# Patient Record
Sex: Male | Born: 1965 | Race: Black or African American | Hispanic: No | Marital: Married | Smoking: Never smoker
Health system: Southern US, Community
[De-identification: ages and names within clinical notes are randomized; demographics above are authoritative.]

## PROBLEM LIST (undated history)

## (undated) DIAGNOSIS — M1990 Unspecified osteoarthritis, unspecified site: Secondary | ICD-10-CM

## (undated) DIAGNOSIS — Z8719 Personal history of other diseases of the digestive system: Secondary | ICD-10-CM

## (undated) DIAGNOSIS — K219 Gastro-esophageal reflux disease without esophagitis: Secondary | ICD-10-CM

## (undated) DIAGNOSIS — K5120 Ulcerative (chronic) proctitis without complications: Secondary | ICD-10-CM

## (undated) DIAGNOSIS — R7303 Prediabetes: Secondary | ICD-10-CM

---

## 1999-06-01 HISTORY — PX: SIGMOIDOSCOPY: SUR1295

## 2004-02-14 ENCOUNTER — Emergency Department: Payer: Self-pay | Admitting: Internal Medicine

## 2005-12-18 ENCOUNTER — Other Ambulatory Visit: Payer: Self-pay

## 2005-12-18 ENCOUNTER — Emergency Department: Payer: Self-pay | Admitting: Emergency Medicine

## 2005-12-24 ENCOUNTER — Emergency Department: Payer: Self-pay | Admitting: Emergency Medicine

## 2006-01-09 ENCOUNTER — Ambulatory Visit: Payer: Self-pay

## 2007-05-01 ENCOUNTER — Other Ambulatory Visit: Payer: Self-pay

## 2007-05-01 ENCOUNTER — Emergency Department: Payer: Self-pay | Admitting: Emergency Medicine

## 2007-08-21 ENCOUNTER — Emergency Department: Payer: Self-pay | Admitting: Emergency Medicine

## 2007-08-22 ENCOUNTER — Ambulatory Visit: Payer: Self-pay | Admitting: Gastroenterology

## 2007-10-24 ENCOUNTER — Ambulatory Visit: Payer: Self-pay | Admitting: Gastroenterology

## 2007-11-01 ENCOUNTER — Ambulatory Visit: Payer: Self-pay | Admitting: Family Medicine

## 2007-12-09 ENCOUNTER — Ambulatory Visit: Payer: Self-pay | Admitting: Gastroenterology

## 2007-12-09 HISTORY — PX: COLONOSCOPY: SHX174

## 2008-03-02 ENCOUNTER — Emergency Department: Payer: Self-pay | Admitting: Internal Medicine

## 2008-07-24 ENCOUNTER — Ambulatory Visit: Payer: Self-pay | Admitting: Family Medicine

## 2008-11-05 ENCOUNTER — Ambulatory Visit: Payer: Self-pay | Admitting: Gastroenterology

## 2008-11-21 ENCOUNTER — Emergency Department: Payer: Self-pay | Admitting: Internal Medicine

## 2011-01-25 ENCOUNTER — Emergency Department: Payer: Self-pay | Admitting: Emergency Medicine

## 2011-01-25 LAB — BASIC METABOLIC PANEL
Creatinine: 1.1 mg/dL (ref 0.60–1.30)
EGFR (African American): >60
EGFR (Non-African Amer.): >60
Glucose: 89 mg/dL (ref 65–99)
Sodium: 142 mmol/L (ref 136–145)

## 2011-01-25 LAB — CBC
HCT: 45.1 % (ref 40.0–52.0)
MCH: 31.1 pg (ref 26.0–34.0)
MCV: 93 fL (ref 80–100)
Platelet: 166 10*3/uL (ref 150–440)

## 2011-01-27 ENCOUNTER — Other Ambulatory Visit: Payer: Self-pay

## 2011-01-27 ENCOUNTER — Emergency Department (HOSPITAL_COMMUNITY)
Admission: EM | Admit: 2011-01-27 | Discharge: 2011-01-28 | Disposition: A | Discharge status: Home / Self Care | Payer: BC Managed Care – PPO | Attending: Emergency Medicine | Admitting: Emergency Medicine

## 2011-01-27 ENCOUNTER — Emergency Department (HOSPITAL_COMMUNITY): Payer: BC Managed Care – PPO

## 2011-01-27 ENCOUNTER — Encounter: Payer: Self-pay | Admitting: *Deleted

## 2011-01-27 DIAGNOSIS — K219 Gastro-esophageal reflux disease without esophagitis: Secondary | ICD-10-CM | POA: Insufficient documentation

## 2011-01-27 DIAGNOSIS — R0789 Other chest pain: Secondary | ICD-10-CM | POA: Insufficient documentation

## 2011-01-27 HISTORY — DX: Gastro-esophageal reflux disease without esophagitis: K21.9

## 2011-01-27 LAB — CBC
MCH: 30.7 pg (ref 26.0–34.0)
Platelets: 151 10*3/uL (ref 150–400)
RBC: 4.27 MIL/uL (ref 4.22–5.81)
WBC: 9.2 10*3/uL (ref 4.0–10.5)

## 2011-01-27 LAB — BASIC METABOLIC PANEL
GFR calc non Af Amer: 85 mL/min — ABNORMAL LOW (ref >90)
Glucose, Bld: 106 mg/dL — ABNORMAL HIGH (ref 70–99)
Potassium: 4.2 mEq/L (ref 3.5–5.1)
Sodium: 137 mEq/L (ref 135–145)

## 2011-01-27 LAB — POCT I-STAT TROPONIN I: Troponin i, poc: 0.01 ng/mL (ref 0.00–0.08)

## 2011-01-27 LAB — DIFFERENTIAL
Basophils Absolute: 0 10*3/uL (ref 0.0–0.1)
Eosinophils Absolute: 0.2 10*3/uL (ref 0.0–0.7)
Lymphocytes Relative: 21 % (ref 12–46)
Lymphs Abs: 1.9 10*3/uL (ref 0.7–4.0)
Neutrophils Relative %: 71 % (ref 43–77)

## 2011-01-27 MED ORDER — MORPHINE SULFATE 4 MG/ML IJ SOLN
4.0000 mg | Freq: Once | INTRAMUSCULAR | Status: AC
  Administered 2011-01-27: 4 mg via INTRAVENOUS
  Filled 2011-01-27: qty 1

## 2011-01-27 MED ORDER — ONDANSETRON HCL 4 MG/2ML IJ SOLN
4.0000 mg | Freq: Once | INTRAMUSCULAR | Status: AC
  Administered 2011-01-27: 4 mg via INTRAVENOUS
  Filled 2011-01-27: qty 2

## 2011-01-27 MED ORDER — KETOROLAC TROMETHAMINE 30 MG/ML IJ SOLN
30.0000 mg | Freq: Once | INTRAMUSCULAR | Status: AC
  Administered 2011-01-28: 30 mg via INTRAVENOUS
  Filled 2011-01-27: qty 1

## 2011-01-27 NOTE — ED Notes (Signed)
Patient transported to X-ray 

## 2011-01-27 NOTE — ED Notes (Addendum)
C/o CP, denies any and or all other sx, onset Wednesday, seen by PCP on Wednesday, referred to card MD in [place], has not seen card MD yet, pain worsening since Wednesday, no aggravating or aleviating sx, NSR on monitor, given ASA 324 & ntg 2 sl PTA with some relief, rates pain on arrival at 4-5/10, "this feels different from GERD". Takes omeprazole & imdur.

## 2011-01-27 NOTE — ED Provider Notes (Signed)
History     CSN: [id number]  Arrival date & time 01/27/11  2033   First MD Initiated Contact with Patient 01/27/11 2038      Chief Complaint  Patient presents with  . Chest Pain    (Consider location/radiation/quality/duration/timing/severity/associated sxs/prior treatment) Patient is a 46 y.o. male presenting with chest pain. The history is provided by the patient.  Chest Pain The chest pain began 2 days ago. Chest pain occurs intermittently. The chest pain is worsening. Associated with: nothing. At its most intense, the pain is at 7/10. The pain is currently at 4/10. The quality of the pain is described as aching and sharp. The pain does not radiate. Exacerbated by: nothing. Pertinent negatives for primary symptoms include no fever, no fatigue, no shortness of breath, no cough and no palpitations.  Pertinent negatives for associated symptoms include no diaphoresis. He tried nitroglycerin and aspirin for the symptoms.   Pt states he started having left sided chest pains 3 days ago. States feels like a "toothache." Was seen in the hospital at [facility] 2 days ago, and also by a cardiologist at the same time. Had labs and and ECHO done. States everything was good. He was sent home and he has a fu appointment on Monday for a stress test. Pt states he called EMS because pain is increasing in severety. States comes independent of activity, not reproducible, lasts few seconds to minutes at a time, do not radiate. He was given SL nitro which he says he took and it is not helping. Denies n/v, dizziness, SOB, diaphoresis.   Past Medical History  Diagnosis Date  . GERD (gastroesophageal reflux disease)     History reviewed. No pertinent past surgical history.  Family History  Problem Relation Age of Onset  . Heart failure Father   . Stroke Other     History  Substance Use Topics  . Smoking status: Never Smoker   . Smokeless tobacco: Not on file  . Alcohol Use: No      Review of  Systems  Constitutional: Negative for fever, chills, diaphoresis and fatigue.  HENT: Negative.   Respiratory: Negative for cough and shortness of breath.   Cardiovascular: Positive for chest pain. Negative for palpitations and leg swelling.  Gastrointestinal: Negative.   Genitourinary: Negative.   Musculoskeletal: Negative.   Skin: Negative.   Neurological: Negative.   Psychiatric/Behavioral: Negative.     Allergies  Review of patient's allergies indicates no known allergies.  Home Medications  No current outpatient prescriptions on file.  BP 142/69  Pulse 64  Temp 98.7 F (37.1 C)  Resp 13  SpO2 100%  Physical Exam  Nursing note and vitals reviewed. Constitutional: He is oriented to person, place, and time. He appears well-developed and well-nourished.  HENT:  Head: Normocephalic.  Eyes: Conjunctivae are normal.  Neck: Neck supple.  Cardiovascular: Normal rate, regular rhythm and normal heart sounds.   Pulmonary/Chest: Effort normal and breath sounds normal. No respiratory distress. He has no wheezes. He has no rales. He exhibits no tenderness.  Abdominal: Soft. Bowel sounds are normal. He exhibits no distension. There is no tenderness.  Musculoskeletal: He exhibits no edema.  Neurological: He is alert and oriented to person, place, and time.  Skin: Skin is warm and dry.  Psychiatric: He has a normal mood and affect.    ED Course  Procedures (including critical care time)  9:04 PM Pt with atypical CP. No medical problems. Has fam hx of cardiac problems in his father  before age 51, no other risk factors. ECG normal. Pt received 325asa and 2 SL nitros prior to arrival. Will check labs and CXR. Pt states he has not had CXR yet. Will monitor.   Date: 01/27/2011  Rate: 60  Rhythm: normal sinus rhythm  QRS Axis: normal  Intervals: normal  ST/T Wave abnormalities: normal  Conduction Disutrbances:none  Narrative Interpretation:   Old EKG Reviewed: No significant  changes noted  Results for orders placed during the hospital encounter of 01/27/11  CBC      Component Value Range   WBC 9.2  4.0 - 10.5 (K/uL)   RBC 4.27  4.22 - 5.81 (MIL/uL)   Hemoglobin 13.1  13.0 - 17.0 (g/dL)   HCT 40.9 (*) 81.1 - 52.0 (%)   MCV 87.8  78.0 - 100.0 (fL)   MCH 30.7  26.0 - 34.0 (pg)   MCHC 34.9  30.0 - 36.0 (g/dL)   RDW 91.4  78.2 - 95.6 (%)   Platelets 151  150 - 400 (K/uL)  DIFFERENTIAL      Component Value Range   Neutrophils Relative 71  43 - 77 (%)   Neutro Abs 6.6  1.7 - 7.7 (K/uL)   Lymphocytes Relative 21  12 - 46 (%)   Lymphs Abs 1.9  0.7 - 4.0 (K/uL)   Monocytes Relative 6  3 - 12 (%)   Monocytes Absolute 0.6  0.1 - 1.0 (K/uL)   Eosinophils Relative 2  0 - 5 (%)   Eosinophils Absolute 0.2  0.0 - 0.7 (K/uL)   Basophils Relative 0  0 - 1 (%)   Basophils Absolute 0.0  0.0 - 0.1 (K/uL)  BASIC METABOLIC PANEL      Component Value Range   Sodium 137  135 - 145 (mEq/L)   Potassium 4.2  3.5 - 5.1 (mEq/L)   Chloride 101  96 - 112 (mEq/L)   CO2 29  19 - 32 (mEq/L)   Glucose, Bld 106 (*) 70 - 99 (mg/dL)   BUN 13  6 - 23 (mg/dL)   Creatinine, Ser 2.13  0.50 - 1.35 (mg/dL)   Calcium 9.1  8.4 - 08.6 (mg/dL)   GFR calc non Af Amer 85 (*) >90 (mL/min)   GFR calc Af Amer >90  >90 (mL/min)  POCT I-STAT TROPONIN I      Component Value Range   Troponin i, poc 0.01  0.00 - 0.08 (ng/mL)   Comment 3           TROPONIN I      Component Value Range   Troponin I <0.30  <0.30 (ng/mL)   Dg Chest 2 View  01/27/2011  *RADIOLOGY REPORT*  Clinical Data: 46 year old male with chest pain.  CHEST - 2 VIEW  Comparison: None  Findings: The cardiomediastinal silhouette is unremarkable. Mild peribronchial thickening is noted. There is no evidence of focal airspace disease, pulmonary edema, pulmonary nodule/mass, pleural effusion, or pneumothorax. No acute bony abnormalities are identified.  IMPRESSION: No evidence of acute cardiopulmonary disease.  Mild bronchitic changes.   Original Report Authenticated By: Rosendo Gros, M.D.     No significant findings on labs or CXR. Two sets of enzymes negative. Doubt acs. CP comes and goes, not pleuritic, pt denies SOB, VS all within normal. Perc negative. Doubt pe. Will d/c home. Pt has an appointment in two days for stress test. PT agrees with the plan.    MDM          Myriam Jacobson  [place], [place] 01/28/11 [phone]

## 2011-01-27 NOTE — ED Notes (Signed)
Here from house, wife to be following, no PCP, was seen Wednesday at [facility] ED, EDPA has been in to room, orders received and initiated.

## 2011-01-27 NOTE — ED Provider Notes (Signed)
BP 112/60  Pulse 62  Temp 98.7 F (37.1 C)  Resp 14  SpO2 99% Pt stable at this time Seen with PA EKG reviewed He reports CP without any other symptoms Stress test is scheduled next week He is well appearing, stable for d/c after second troponin   Joya Gaskins, MD 01/27/11 2328

## 2011-01-28 LAB — TROPONIN I: Troponin I: <0.3 ng/mL (ref <0.30)

## 2011-01-28 MED ORDER — HYDROCODONE-ACETAMINOPHEN 5-500 MG PO TABS: 1.0000 | ORAL_TABLET | Freq: Four times a day (QID) | ORAL | Status: AC | PRN

## 2011-01-28 NOTE — ED Notes (Signed)
Patient is AOx4 and pain free at discharge.  His daughter is here to give him a ride home.

## 2011-01-29 NOTE — ED Provider Notes (Signed)
Medical screening examination/treatment/procedure(s) were conducted as a shared visit with non-physician practitioner(s) and myself.  I personally evaluated the patient during the encounter   Alaija Ruble W Kelijah Towry, MD 01/29/11 0842 

## 2011-01-30 ENCOUNTER — Ambulatory Visit: Payer: Self-pay | Admitting: Internal Medicine

## 2012-06-24 ENCOUNTER — Ambulatory Visit: Payer: Self-pay | Admitting: Gastroenterology

## 2012-06-24 HISTORY — PX: COLONOSCOPY: SHX174

## 2013-07-18 ENCOUNTER — Ambulatory Visit: Payer: Self-pay | Admitting: Gastroenterology

## 2013-08-29 ENCOUNTER — Ambulatory Visit: Payer: Self-pay | Admitting: Family Medicine

## 2014-01-08 ENCOUNTER — Emergency Department: Payer: Self-pay | Admitting: Emergency Medicine

## 2014-01-08 LAB — BASIC METABOLIC PANEL
Anion Gap: 8 (ref 7–16)
BUN: 11 mg/dL (ref 7–18)
CALCIUM: 8.2 mg/dL — AB (ref 8.5–10.1)
CO2: 28 mmol/L (ref 21–32)
CREATININE: 1.11 mg/dL (ref 0.60–1.30)
Chloride: 103 mmol/L (ref 98–107)
EGFR (African American): >60
GLUCOSE: 99 mg/dL (ref 65–99)
OSMOLALITY: 277 (ref 275–301)
Potassium: 3.8 mmol/L (ref 3.5–5.1)
SODIUM: 139 mmol/L (ref 136–145)

## 2014-01-08 LAB — CBC
HCT: 42.9 % (ref 40.0–52.0)
HGB: 14.2 g/dL (ref 13.0–18.0)
MCH: 30.9 pg (ref 26.0–34.0)
MCHC: 33 g/dL (ref 32.0–36.0)
MCV: 93 fL (ref 80–100)
PLATELETS: 161 10*3/uL (ref 150–440)
RBC: 4.59 10*6/uL (ref 4.40–5.90)
RDW: 12.7 % (ref 11.5–14.5)
WBC: 7.4 10*3/uL (ref 3.8–10.6)

## 2014-01-08 LAB — TROPONIN I: Troponin-I: <0.02 ng/mL

## 2014-01-09 LAB — D-DIMER(ARMC): D-DIMER: 235 ng/mL

## 2014-01-19 ENCOUNTER — Emergency Department: Payer: Self-pay | Admitting: Emergency Medicine

## 2014-01-19 LAB — URINALYSIS, COMPLETE
BACTERIA: NONE SEEN
BILIRUBIN, UR: NEGATIVE
BLOOD: NEGATIVE
Glucose,UR: NEGATIVE mg/dL (ref 0–75)
Ketone: NEGATIVE
Leukocyte Esterase: NEGATIVE
NITRITE: NEGATIVE
PH: 8 (ref 4.5–8.0)
PROTEIN: NEGATIVE
RBC,UR: 9 /HPF (ref 0–5)
SQUAMOUS EPITHELIAL: NONE SEEN
Specific Gravity: 1.02 (ref 1.003–1.030)

## 2014-01-19 LAB — CBC WITH DIFFERENTIAL/PLATELET
BASOS PCT: 0.6 %
Basophil #: 0 10*3/uL (ref 0.0–0.1)
EOS ABS: 0.1 10*3/uL (ref 0.0–0.7)
EOS PCT: 1.3 %
HCT: 44.8 % (ref 40.0–52.0)
HGB: 14.8 g/dL (ref 13.0–18.0)
LYMPHS ABS: 1.4 10*3/uL (ref 1.0–3.6)
Lymphocyte %: 18.3 %
MCH: 30.6 pg (ref 26.0–34.0)
MCHC: 33 g/dL (ref 32.0–36.0)
MCV: 93 fL (ref 80–100)
Monocyte #: 0.6 x10 3/mm (ref 0.2–1.0)
Monocyte %: 7.6 %
NEUTROS ABS: 5.4 10*3/uL (ref 1.4–6.5)
Neutrophil %: 72.2 %
Platelet: 173 10*3/uL (ref 150–440)
RBC: 4.83 10*6/uL (ref 4.40–5.90)
RDW: 12.9 % (ref 11.5–14.5)
WBC: 7.4 10*3/uL (ref 3.8–10.6)

## 2014-01-19 LAB — COMPREHENSIVE METABOLIC PANEL
ALBUMIN: 4 g/dL (ref 3.4–5.0)
ALK PHOS: 73 U/L
ALT: 41 U/L
AST: 21 U/L (ref 15–37)
Anion Gap: 8 (ref 7–16)
BILIRUBIN TOTAL: 0.8 mg/dL (ref 0.2–1.0)
BUN: 13 mg/dL (ref 7–18)
CO2: 30 mmol/L (ref 21–32)
Calcium, Total: 8.6 mg/dL (ref 8.5–10.1)
Chloride: 102 mmol/L (ref 98–107)
Creatinine: 1.19 mg/dL (ref 0.60–1.30)
Glucose: 98 mg/dL (ref 65–99)
Osmolality: 279 (ref 275–301)
Potassium: 4.1 mmol/L (ref 3.5–5.1)
Sodium: 140 mmol/L (ref 136–145)
TOTAL PROTEIN: 7.9 g/dL (ref 6.4–8.2)

## 2014-01-19 LAB — TROPONIN I

## 2014-01-19 LAB — LIPASE, BLOOD: LIPASE: 73 U/L (ref 73–393)

## 2014-02-06 ENCOUNTER — Ambulatory Visit: Payer: Self-pay | Admitting: Gastroenterology

## 2014-02-06 HISTORY — PX: ESOPHAGOGASTRODUODENOSCOPY: SHX1529

## 2014-05-18 LAB — SURGICAL PATHOLOGY

## 2014-06-12 ENCOUNTER — Encounter: Payer: Self-pay | Admitting: Emergency Medicine

## 2014-06-12 ENCOUNTER — Ambulatory Visit
Admission: EM | Admit: 2014-06-12 | Discharge: 2014-06-12 | Disposition: A | Discharge status: Home / Self Care | Payer: Federal, State, Local not specified - PPO | Attending: Family Medicine | Admitting: Family Medicine

## 2014-06-12 DIAGNOSIS — N451 Epididymitis: Secondary | ICD-10-CM

## 2014-06-12 MED ORDER — SILDENAFIL CITRATE 50 MG PO TABS: 50.0000 mg | ORAL_TABLET | Freq: Every day | ORAL | Status: DC | PRN

## 2014-06-12 MED ORDER — DOXYCYCLINE HYCLATE 100 MG PO TABS: 100.0000 mg | ORAL_TABLET | Freq: Two times a day (BID) | ORAL | Status: DC

## 2014-06-12 NOTE — ED Provider Notes (Signed)
CSN: [id number]     Arrival date & time 06/12/14  1027 History   First MD Initiated Contact with Patient 06/12/14 1105     Chief Complaint  Patient presents with  . Groin Pain    left sided  . Testicle Pain    left    (Consider location/radiation/quality/duration/timing/severity/associated sxs/prior Treatment) HPI Comments: Patient presents with a 3 weeks h/o left scrotal pain, which is sharp, short lasting and intermittent. Denies any discharge, injury/trauma, redness, swelling, fevers, chills, weight loss, penile discharge, skin lesions. Also complains of erectile problems for months.   The history is provided by the patient.    Past Medical History  Diagnosis Date  . GERD (gastroesophageal reflux disease)    History reviewed. No pertinent past surgical history. Family History  Problem Relation Age of Onset  . Heart failure Father   . Stroke Other    History  Substance Use Topics  . Smoking status: Never Smoker   . Smokeless tobacco: Never Used  . Alcohol Use: No    Review of Systems  Allergies  Review of patient's allergies indicates no known allergies.  Home Medications   Prior to Admission medications   Medication Sig Start Date End Date Taking? Authorizing Provider  doxycycline (VIBRA-TABS) 100 MG tablet Take 1 tablet (100 mg total) by mouth 2 (two) times daily. 06/12/14   Payton Mccallumrlando Narely Nobles, MD  isosorbide mononitrate (IMDUR) 30 MG 24 hr tablet Take 30 mg by mouth daily.      Historical Provider, MD  omeprazole (PRILOSEC) 20 MG capsule Take 20 mg by mouth daily.      Historical Provider, MD  sildenafil (VIAGRA) 50 MG tablet Take 1 tablet (50 mg total) by mouth daily as needed for erectile dysfunction. 06/12/14   Payton Mccallumrlando Mercer Peifer, MD   BP 156/84 mmHg  Pulse 70  Temp(Src) 97.4 F (36.3 C) (Tympanic)  Resp 16  Ht 5\' 11"  (1.803 m)  Wt 215 lb (97.523 kg)  BMI 30.00 kg/m2  SpO2 99% Physical Exam  Constitutional: He appears well-developed. No distress.  Genitourinary:  Penis normal. Cremasteric reflex is present. Right testis shows no mass, no swelling and no tenderness. Left testis shows tenderness (mild at epididymis). Left testis shows no mass and no swelling.  Mild left epididymal tenderness to palpation  Skin: He is not diaphoretic.  Nursing note and vitals reviewed.   ED Course  Procedures (including critical care time) Labs Review Labs Reviewed - No data to display  Imaging Review No results found.   MDM   1. Epididymitis    Discharge Medication List as of 06/12/2014 11:21 AM    START taking these medications   Details  doxycycline (VIBRA-TABS) 100 MG tablet Take 1 tablet (100 mg total) by mouth 2 (two) times daily., Starting 06/12/2014, Until Discontinued, Normal    sildenafil (VIAGRA) 50 MG tablet Take 1 tablet (50 mg total) by mouth daily as needed for erectile dysfunction., Starting 06/12/2014, Until Discontinued, Normal      Plan: 1.Diagnosis reviewed with patient 2. rx as per orders; risks, benefits, potential side effects reviewed with patient 3. Recommend establish care with a PCP  4. F/u prn if symptoms worsen or don't improve    Payton Mccallumrlando Angeliyah Kirkey, MD 06/12/14 1407

## 2014-06-12 NOTE — Discharge Instructions (Signed)
Epididymitis °Epididymitis is a swelling (inflammation) of the epididymis. The epididymis is a cord-like structure along the back part of the testicle. Epididymitis is usually, but not always, caused by infection. This is usually a sudden problem beginning with chills, fever and pain behind the scrotum and in the testicle. There may be swelling and redness of the testicle. °DIAGNOSIS  °Physical examination will reveal a tender, swollen epididymis. Sometimes, cultures are obtained from the urine or from prostate secretions to help find out if there is an infection or if the cause is a different problem. Sometimes, blood work is performed to see if your white blood cell count is elevated and if a germ (bacterial) or viral infection is present. Using this knowledge, an appropriate medicine which kills germs (antibiotic) can be chosen by your caregiver. A viral infection causing epididymitis will most often go away (resolve) without treatment. °HOME CARE INSTRUCTIONS  °· Hot sitz baths for 20 minutes, 4 times per day, may help relieve pain. °· Only take over-the-counter or prescription medicines for pain, discomfort or fever as directed by your caregiver. °· Take all medicines, including antibiotics, as directed. Take the antibiotics for the full prescribed length of time even if you are feeling better. °· It is very important to keep all follow-up appointments. °SEEK IMMEDIATE MEDICAL CARE IF:  °· You have a fever. °· You have pain not relieved with medicines. °· You have any worsening of your problems. °· Your pain seems to come and go. °· You develop pain, redness, and swelling in the scrotum and surrounding areas. °MAKE SURE YOU:  °· Understand these instructions. °· Will watch your condition. °· Will get help right away if you are not doing well or get worse. °Document Released: 01/07/2000 Document Revised: 04/03/2011 Document Reviewed: 11/26/2008 °Exit[employer]® Patient Information ©2015 [employer]. This information  is not intended to replace advice given to you by your health care provider. Make sure you discuss any questions you have with your health care provider. ° °

## 2014-06-12 NOTE — ED Notes (Signed)
Patient c/o left groin pain and pain in his left testicle for the past 3 weeks.  Patient denies fevers. Patient denies pain when urinating.

## 2016-07-05 ENCOUNTER — Other Ambulatory Visit: Payer: Self-pay | Admitting: Family Medicine

## 2016-07-05 DIAGNOSIS — M7521 Bicipital tendinitis, right shoulder: Secondary | ICD-10-CM

## 2016-07-05 DIAGNOSIS — M7581 Other shoulder lesions, right shoulder: Secondary | ICD-10-CM

## 2016-07-12 ENCOUNTER — Ambulatory Visit
Admission: RE | Admit: 2016-07-12 | Discharge: 2016-07-12 | Disposition: A | Discharge status: Home / Self Care | Payer: BLUE CROSS/BLUE SHIELD | Source: Ambulatory Visit | Attending: Family Medicine | Admitting: Family Medicine

## 2016-07-12 DIAGNOSIS — M7521 Bicipital tendinitis, right shoulder: Secondary | ICD-10-CM | POA: Insufficient documentation

## 2016-07-12 DIAGNOSIS — M7581 Other shoulder lesions, right shoulder: Secondary | ICD-10-CM | POA: Diagnosis present

## 2016-08-23 ENCOUNTER — Encounter
Admission: RE | Disposition: A | Discharge status: Home / Self Care | Payer: Self-pay | Source: Ambulatory Visit | Attending: Surgery

## 2016-08-23 ENCOUNTER — Ambulatory Visit
Admission: RE | Admit: 2016-08-23 | Discharge: 2016-08-23 | Disposition: A | Discharge status: Home / Self Care | Payer: BLUE CROSS/BLUE SHIELD | Source: Ambulatory Visit | Attending: Surgery | Admitting: Surgery

## 2016-08-23 ENCOUNTER — Ambulatory Visit: Payer: BLUE CROSS/BLUE SHIELD | Admitting: Anesthesiology

## 2016-08-23 DIAGNOSIS — Z79899 Other long term (current) drug therapy: Secondary | ICD-10-CM | POA: Diagnosis not present

## 2016-08-23 DIAGNOSIS — K219 Gastro-esophageal reflux disease without esophagitis: Secondary | ICD-10-CM | POA: Diagnosis not present

## 2016-08-23 DIAGNOSIS — M25811 Other specified joint disorders, right shoulder: Secondary | ICD-10-CM | POA: Insufficient documentation

## 2016-08-23 DIAGNOSIS — M75101 Unspecified rotator cuff tear or rupture of right shoulder, not specified as traumatic: Secondary | ICD-10-CM | POA: Insufficient documentation

## 2016-08-23 HISTORY — PX: SHOULDER ARTHROSCOPY WITH ROTATOR CUFF REPAIR: SHX5685

## 2016-08-23 HISTORY — DX: Unspecified osteoarthritis, unspecified site: M19.90

## 2016-08-23 HISTORY — PX: SHOULDER OPEN ROTATOR CUFF REPAIR: SHX2407

## 2016-08-23 SURGERY — ARTHROSCOPY, SHOULDER, WITH ROTATOR CUFF REPAIR
Anesthesia: Regional | Laterality: Right | Wound class: Clean

## 2016-08-23 MED ORDER — LACTATED RINGERS IV SOLN
INTRAVENOUS | Status: DC
  Administered 2016-08-23 (×2): via INTRAVENOUS

## 2016-08-23 MED ORDER — LIDOCAINE HCL (CARDIAC) 20 MG/ML IV SOLN
INTRAVENOUS | Status: DC | PRN
  Administered 2016-08-23: 40 mg via INTRATRACHEAL

## 2016-08-23 MED ORDER — FENTANYL CITRATE (PF) 100 MCG/2ML IJ SOLN
INTRAMUSCULAR | Status: DC | PRN
  Administered 2016-08-23: 50 ug via INTRAVENOUS

## 2016-08-23 MED ORDER — OXYCODONE HCL 5 MG/5ML PO SOLN: 5.0000 mg | Freq: Once | ORAL | Status: DC | PRN

## 2016-08-23 MED ORDER — PROPOFOL 10 MG/ML IV BOLUS
INTRAVENOUS | Status: DC | PRN
  Administered 2016-08-23: 100 mg via INTRAVENOUS

## 2016-08-23 MED ORDER — DEXAMETHASONE SODIUM PHOSPHATE 4 MG/ML IJ SOLN
INTRAMUSCULAR | Status: DC | PRN
  Administered 2016-08-23: 4 mg via INTRAVENOUS

## 2016-08-23 MED ORDER — GLYCOPYRROLATE 0.2 MG/ML IJ SOLN
INTRAMUSCULAR | Status: DC | PRN
  Administered 2016-08-23: 0.1 mg via INTRAVENOUS

## 2016-08-23 MED ORDER — OXYCODONE HCL 5 MG PO TABS: 5.0000 mg | ORAL_TABLET | Freq: Once | ORAL | Status: DC | PRN

## 2016-08-23 MED ORDER — LACTATED RINGERS IV SOLN: INTRAVENOUS | Status: DC

## 2016-08-23 MED ORDER — ACETAMINOPHEN 10 MG/ML IV SOLN: 1000.0000 mg | Freq: Once | INTRAVENOUS | Status: DC | PRN

## 2016-08-23 MED ORDER — MIDAZOLAM HCL 5 MG/5ML IJ SOLN
INTRAMUSCULAR | Status: DC | PRN
  Administered 2016-08-23 (×2): 1 mg via INTRAVENOUS

## 2016-08-23 MED ORDER — ROPIVACAINE HCL 5 MG/ML IJ SOLN
INTRAMUSCULAR | Status: DC | PRN
  Administered 2016-08-23: 40 mL

## 2016-08-23 MED ORDER — OXYCODONE HCL 5 MG PO TABS: 5.0000 mg | ORAL_TABLET | ORAL | 0 refills | Status: DC | PRN

## 2016-08-23 MED ORDER — ONDANSETRON HCL 4 MG/2ML IJ SOLN: 4.0000 mg | Freq: Once | INTRAMUSCULAR | Status: DC | PRN

## 2016-08-23 MED ORDER — LIDOCAINE-EPINEPHRINE 1 %-1:100000 IJ SOLN
INTRAMUSCULAR | Status: DC | PRN
  Administered 2016-08-23: 20 mL

## 2016-08-23 MED ORDER — FENTANYL CITRATE (PF) 100 MCG/2ML IJ SOLN: 25.0000 ug | INTRAMUSCULAR | Status: DC | PRN

## 2016-08-23 MED ORDER — CEFAZOLIN SODIUM-DEXTROSE 2-4 GM/100ML-% IV SOLN: 2.0000 g | Freq: Once | INTRAVENOUS | Status: DC

## 2016-08-23 SURGICAL SUPPLY — 39 items
ANCHOR JUGGERKNOT WTAP NDL 2.9 (Anchor) ×4 IMPLANT
ANCHOR SUT QUATTRO KNTLS 4.5 (Anchor) ×4 IMPLANT
BIT DRILL JUGRKNT W/NDL BIT2.9 (DRILL) ×2 IMPLANT
BLADE FULL RADIUS 3.5 (BLADE) ×2 IMPLANT
BUR ACROMIONIZER 4.0 (BURR) IMPLANT
CANNULA SHAVER 8MMX76MM (CANNULA) ×2 IMPLANT
CHLORAPREP W/TINT 26ML (MISCELLANEOUS) ×4 IMPLANT
COVER LIGHT HANDLE UNIVERSAL (MISCELLANEOUS) ×4 IMPLANT
COVER MAYO STAND STRL (DRAPES) ×2 IMPLANT
DRAPE IMP U-DRAPE 54X76 (DRAPES) ×4 IMPLANT
DRILL JUGGERKNOT W/NDL BIT 2.9 (DRILL) ×4
GAUZE PETRO XEROFOAM 1X8 (MISCELLANEOUS) ×2 IMPLANT
GAUZE SPONGE 4X4 12PLY STRL (GAUZE/BANDAGES/DRESSINGS) ×2 IMPLANT
GLOVE BIO SURGEON STRL SZ8 (GLOVE) ×4 IMPLANT
GLOVE INDICATOR 8.0 STRL GRN (GLOVE) ×2 IMPLANT
GOWN STRL REUS W/ TWL LRG LVL3 (GOWN DISPOSABLE) ×1 IMPLANT
GOWN STRL REUS W/ TWL XL LVL3 (GOWN DISPOSABLE) ×1 IMPLANT
GOWN STRL REUS W/TWL LRG LVL3 (GOWN DISPOSABLE) ×1
GOWN STRL REUS W/TWL XL LVL3 (GOWN DISPOSABLE) ×1
IV LACTATED RINGER IRRG 3000ML (IV SOLUTION) ×1
IV LR IRRIG 3000ML ARTHROMATIC (IV SOLUTION) ×1 IMPLANT
KIT ROOM TURNOVER OR (KITS) ×2 IMPLANT
MANIFOLD 4PT FOR NEPTUNE1 (MISCELLANEOUS) ×2 IMPLANT
MAT BLUE FLOOR 46X72 FLO (MISCELLANEOUS) ×2 IMPLANT
NDL MAYO CATGUT SZ5 (NEEDLE)
NDL SUT 5 .5 CRC TPR PNT MAYO (NEEDLE) IMPLANT
NEEDLE HYPO 21X1.5 SAFETY (NEEDLE) ×4 IMPLANT
PACK ARTHROSCOPY SHOULDER (MISCELLANEOUS) ×2 IMPLANT
PAD GROUND ADULT SPLIT (MISCELLANEOUS) ×2 IMPLANT
SLING ULTRA II LG (MISCELLANEOUS) ×2 IMPLANT
STAPLER SKIN PROX 35W (STAPLE) ×2 IMPLANT
STRAP BODY AND KNEE 60X3 (MISCELLANEOUS) ×4 IMPLANT
SUT ETHIBOND 0 MO6 C/R (SUTURE) ×6 IMPLANT
SUT VIC AB 2-0 CT1 27 (SUTURE) ×4
SUT VIC AB 2-0 CT1 TAPERPNT 27 (SUTURE) ×4 IMPLANT
SYR 30ML LL (SYRINGE) ×2 IMPLANT
TAPE MICROFOAM 4IN (TAPE) ×2 IMPLANT
TUBING ARTHRO INFLOW-ONLY STRL (TUBING) ×2 IMPLANT
WAND HAND CNTRL MULTIVAC 90 (MISCELLANEOUS) ×4 IMPLANT

## 2016-08-23 NOTE — H&P (Signed)
Paper H&P to be scanned into permanent record. H&P reviewed and patient re-examined. No changes. 

## 2016-08-23 NOTE — Anesthesia Postprocedure Evaluation (Signed)
Anesthesia Post Note  Patient: Marvin Douglas  Procedure(s) Performed: Procedure(s) (LRB): Limited  right shoulder arthroscopic debridement, arthroscopic subacromial decompression (Right) ROTATOR CUFF REPAIR SHOULDER OPEN (Right)  Patient location during evaluation: PACU Anesthesia Type: Regional Level of consciousness: awake and alert, oriented and patient cooperative Pain management: pain level controlled Vital Signs Assessment: post-procedure vital signs reviewed and stable Respiratory status: spontaneous breathing, nonlabored ventilation and respiratory function stable Cardiovascular status: blood pressure returned to baseline and stable Postop Assessment: adequate PO intake Anesthetic complications: no    Reed BreechAndrea Grahm Etsitty

## 2016-08-23 NOTE — Anesthesia Preprocedure Evaluation (Signed)
Anesthesia Evaluation  Patient identified by MRN, date of birth, ID band  Reviewed: Allergy & Precautions, NPO status , Patient's Chart, lab work & pertinent test results  History of Anesthesia Complications Negative for: history of anesthetic complications  Airway Mallampati: II  TM Distance: >3 FB Neck ROM: Full    Dental  (+)    Pulmonary  Snoring    Pulmonary exam normal breath sounds clear to auscultation       Cardiovascular Exercise Tolerance: Good negative cardio ROS   Rhythm:Regular Rate:Bradycardia     Neuro/Psych negative neurological ROS     GI/Hepatic GERD  Controlled,  Endo/Other  negative endocrine ROS  Renal/GU negative Renal ROS     Musculoskeletal   Abdominal   Peds  Hematology negative hematology ROS (+)   Anesthesia Other Findings   Reproductive/Obstetrics                             Anesthesia Physical Anesthesia Plan  ASA: II  Anesthesia Plan: General and Regional   Post-op Pain Management:  Regional for Post-op pain   Induction: Intravenous  PONV Risk Score and Plan: 1 and Ondansetron and Dexamethasone  Airway Management Planned: LMA  Additional Equipment:   Intra-op Plan:   Post-operative Plan: Extubation in OR  Informed Consent: I have reviewed the patients History and Physical, chart, labs and discussed the procedure including the risks, benefits and alternatives for the proposed anesthesia with the patient or authorized representative who has indicated his/her understanding and acceptance.     Plan Discussed with: CRNA  Anesthesia Plan Comments:         Anesthesia Quick Evaluation

## 2016-08-23 NOTE — Op Note (Signed)
08/23/2016  12:22 PM  Patient:   Marvin Douglas  Pre-Op Diagnosis:   Impingement/tendinopathy with rotator cuff tear, right shoulder.  Post-Op Diagnosis: Impingement/tendinopathy with rotator cuff tear and labral fraying, right shoulder.  Procedure: Limited arthroscopic debridement, arthroscopic subacromial decompression, and mini-open rotator cuff repair, right shoulder.  Anesthesia: General endotracheal with interscalene block placed preoperatively by the anesthesiologist.  Surgeon:   Maryagnes AmosJ. Jeffrey Dusty Wagoner, MD  Assistant:   None  Findings: As above. There was fraying of the labrum anteriorly and superiorly without frank detachment from the glenoid. The rotator cuff tear involved the anterior and mid insertional fibers of the supraspinatus tendon. The remaining portions of the rotator cuff all were in satisfactory condition, as was the biceps tendon. The articular surfaces of the glenoid and humerus both were in satisfactory condition as well.  Complications: None  Fluids:   1000 cc  Estimated blood loss: 15 cc  Tourniquet time: None  Drains: None  Closure: Staples   Brief clinical note: The patient is a 51 year old male with a history of right shoulder pain. The patient's symptoms have progressed despite medications, activity modification, etc. The patient's history and examination are consistent with impingement/tendinopathy with a rotator cuff tear. These findings were confirmed by MRI scan. The patient presents at this time for definitive management of these shoulder symptoms.  Procedure: The patient underwent placement of an interscalene block by the anesthesiologist in the preoperative holding area before being brought into the operating room and lain in the supine position. The patient then underwent general endotracheal intubation and anesthesia before being repositioned in the beach chair position using the beach chair positioner. The right shoulder  and upper extremity were prepped with ChloraPrep solution before being draped sterilely. Preoperative antibiotics were administered. A timeout was performed to confirm the proper surgical site before the expected portal sites and incision site were injected with 0.5% Sensorcaine with epinephrine. A posterior portal was created and the glenohumeral joint thoroughly inspected with the findings as described above. An anterior portal was created using an outside-in technique. The labrum and rotator cuff were further probed, again confirming the above-noted findings. The areas of labral fraying anteriorly and superiorly were debrided back to stable margins using the full-radius resector. Areas of synovitis anteriorly, superiorly, and posterior superiorly also were debrided back to stable margins using the full-radius resector. The biceps was pulled into the joint using a probe and found to be in satisfactory condition. The ArthroCare wand was inserted and used to obtain hemostasis as well as to "anneal" the labrum superiorly and anteriorly. The instruments were removed from the joint after suctioning the excess fluid.  The camera was repositioned through the posterior portal into the subacromial space. A separate lateral portal was created using an outside-in technique. The 3.5 mm full-radius resector was introduced and used to perform a subtotal bursectomy. The ArthroCare wand was then inserted and used to remove the periosteal tissue off the undersurface of the anterior third of the acromion as well as to recess the coracoacromial ligament from its attachment along the anterior and lateral margins of the acromion. The 4.0 mm acromionizing bur was introduced and used to complete the decompression by removing the undersurface of the anterior third of the acromion. The full radius resector was reintroduced to remove any residual bony debris before the ArthroCare wand was reintroduced to obtain hemostasis. The  instruments were then removed from the subacromial space after suctioning the excess fluid.  An approximately 4-5 cm incision was made  over the anterolateral aspect of the shoulder beginning at the anterolateral corner of the acromion and extending distally in line with the bicipital groove. This incision was carried down through the subcutaneous tissues to expose the deltoid fascia. The raphae between the anterior and middle thirds was identified and this plane developed to provide access into the subacromial space. Additional bursal tissues were debrided sharply using Metzenbaum scissors. The rotator cuff tear was readily identified. The margins were debrided sharply with a #15 blade and the exposed greater tuberosity roughened with a rongeur. The tear was repaired using two Biomet 2.9 mm JuggerKnot anchors. These sutures were then brought back laterally and secured using two [place] QuatroLink anchors to create a two-layer closure. An apparent watertight closure was obtained.  The wound was copiously irrigated with sterile saline solution before the deltoid raphae was reapproximated using 2-0 Vicryl interrupted sutures. The subcutaneous tissues were closed in two layers using 2-0 Vicryl interrupted sutures before the skin was closed using staples. The portal sites also were closed using staples. A sterile bulky dressing was applied to the shoulder before the arm was placed into a shoulder immobilizer. The patient was then awakened, extubated, and returned to the recovery room in satisfactory condition after tolerating the procedure well.

## 2016-08-23 NOTE — Transfer of Care (Signed)
Immediate Anesthesia Transfer of Care Note  Patient: Marvin Douglas  Procedure(s) Performed: Procedure(s): SHOULDER ARTHROSCOPY WITH DEBRIDEMENT DECOMPRESSION MINI OPENROTATOR CUFF REPAIR AND POSSIBLE BICEPS TENODESIS (Right)  Patient Location: PACU  Anesthesia Type: General, Regional  Level of Consciousness: awake, alert  and patient cooperative  Airway and Oxygen Therapy: Patient Spontanous Breathing and Patient connected to supplemental oxygen  Post-op Assessment: Post-op Vital signs reviewed, Patient's Cardiovascular Status Stable, Respiratory Function Stable, Patent Airway and No signs of Nausea or vomiting  Post-op Vital Signs: Reviewed and stable  Complications: No apparent anesthesia complications

## 2016-08-23 NOTE — Discharge Instructions (Signed)
General Anesthesia, Adult, Care After These instructions provide you with information about caring for yourself after your procedure. Your health care provider may also give you more specific instructions. Your treatment has been planned according to current medical practices, but problems sometimes occur. Call your health care provider if you have any problems or questions after your procedure. What can I expect after the procedure? After the procedure, it is common to have:  Vomiting.  A sore throat.  Mental slowness.  It is common to feel:  Nauseous.  Cold or shivery.  Sleepy.  Tired.  Sore or achy, even in parts of your body where you did not have surgery.  Follow these instructions at home: For at least 24 hours after the procedure:  Do not: ? Participate in activities where you could fall or become injured. ? Drive. ? Use heavy machinery. ? Drink alcohol. ? Take sleeping pills or medicines that cause drowsiness. ? Make important decisions or sign legal documents. ? Take care of children on your own.  Rest. Eating and drinking  If you vomit, drink water, juice, or soup when you can drink without vomiting.  Drink enough fluid to keep your urine clear or pale yellow.  Make sure you have little or no nausea before eating solid foods.  Follow the diet recommended by your health care provider. General instructions  Have a responsible adult stay with you until you are awake and alert.  Return to your normal activities as told by your health care provider. Ask your health care provider what activities are safe for you.  Take over-the-counter and prescription medicines only as told by your health care provider.  If you smoke, do not smoke without supervision.  Keep all follow-up visits as told by your health care provider. This is important. Contact a health care provider if:  You continue to have nausea or vomiting at home, and medicines are not helpful.  You  cannot drink fluids or start eating again.  You cannot urinate after 8-12 hours.  You develop a skin rash.  You have fever.  You have increasing redness at the site of your procedure. Get help right away if:  You have difficulty breathing.  You have chest pain.  You have unexpected bleeding.  You feel that you are having a life-threatening or urgent problem. This information is not intended to replace advice given to you by your health care provider. Make sure you discuss any questions you have with your health care provider. Document Released: 04/17/2000 Document Revised: 06/14/2015 Document Reviewed: 12/24/2014 Elsevier Interactive Patient Education  2018 [employer]  Keep dressing dry and intact.  May shower after dressing changed on post-op day #4 (Sunday).  Cover staples with Band-Aids after drying off. Apply ice frequently to shoulder. Take ibuprofen 800 mg TID with meals for 7-10 days, then as necessary. Take oxycodone as prescribed when needed.  May supplement with ES Tylenol if necessary. Keep shoulder immobilizer on at all times except may remove for bathing purposes. Follow-up in 10-14 days or as scheduled.

## 2016-08-23 NOTE — Progress Notes (Signed)
Assisted Dr. Sherian ReinMuzzoni with right, ultrasound guided, supraclavicular block. Side rails up, monitors on throughout procedure. See vital signs in flow sheet. Tolerated Procedure well.

## 2016-08-23 NOTE — Anesthesia Procedure Notes (Signed)
Procedure Name: LMA Insertion Date/Time: 08/23/2016 10:48 AM Performed by: Jimmy PicketAMYOT, Darian Cansler Pre-anesthesia Checklist: Patient identified, Emergency Drugs available, Suction available, Timeout performed and Patient being monitored Patient Re-evaluated:Patient Re-evaluated prior to induction Oxygen Delivery Method: Circle system utilized Preoxygenation: Pre-oxygenation with 100% oxygen Induction Type: IV induction LMA: LMA inserted LMA Size: 4.0 Number of attempts: 1 Placement Confirmation: positive ETCO2 and breath sounds checked- equal and bilateral Tube secured with: Tape

## 2016-08-23 NOTE — Anesthesia Procedure Notes (Signed)
Anesthesia Regional Block: Interscalene brachial plexus block   Pre-Anesthetic Checklist: ,, timeout performed, Correct Patient, Correct Site, Correct Laterality, Correct Procedure, Correct Position, site marked, Risks and benefits discussed,  Surgical consent,  Pre-op evaluation,  At surgeon's request and post-op pain management  Laterality: Right  Prep: chloraprep       Needles:  Injection technique: Single-shot  Needle Type: Stimiplex     Needle Length: 5cm  Needle Gauge: 21     Additional Needles:   Procedures: ultrasound guided,,,,,,,,  Narrative:  Start time: 08/23/2016 9:47 AM End time: 08/23/2016 9:57 AM Injection made incrementally with aspirations every 5 mL.  Performed by: Personally  Anesthesiologist: Reed BreechMAZZONI, Monisha Siebel  Additional Notes: Functioning IV was confirmed and monitors applied. Ultrasound guidance: relevant anatomy identified, needle position confirmed, local anesthetic spread visualized around nerve(s)., vascular puncture avoided.  Image printed for medical record.  Negative aspiration and no paresthesias; incremental administration of local anesthetic. The patient tolerated the procedure well. Vitals signes recorded in RN notes.

## 2016-08-24 ENCOUNTER — Encounter: Payer: Self-pay | Admitting: Surgery

## 2016-12-18 ENCOUNTER — Ambulatory Visit (INDEPENDENT_AMBULATORY_CARE_PROVIDER_SITE_OTHER): Payer: BLUE CROSS/BLUE SHIELD | Admitting: Urology

## 2016-12-18 ENCOUNTER — Encounter: Payer: Self-pay | Admitting: Urology

## 2016-12-18 VITALS — BP 114/70 | HR 69 | Ht 71.0 in | Wt 204.4 lb

## 2016-12-18 DIAGNOSIS — R3129 Other microscopic hematuria: Secondary | ICD-10-CM

## 2016-12-18 DIAGNOSIS — R300 Dysuria: Secondary | ICD-10-CM | POA: Diagnosis not present

## 2016-12-18 LAB — URINALYSIS, COMPLETE
Bilirubin, UA: NEGATIVE
Glucose, UA: NEGATIVE
KETONES UA: NEGATIVE
Nitrite, UA: NEGATIVE
PH UA: 7 (ref 5.0–7.5)
Protein, UA: NEGATIVE
SPEC GRAV UA: 1.02 (ref 1.005–1.030)
Urobilinogen, Ur: 0.2 mg/dL (ref 0.2–1.0)

## 2016-12-18 LAB — MICROSCOPIC EXAMINATION

## 2016-12-18 NOTE — Progress Notes (Signed)
12/18/2016 11:25 AM   Marvin Douglas 1966/01/21 [id number]  Referring provider: Kaylyn Limhoiniere, G Laurent, NP [address] [place], [place] [address]  Chief Complaint  Patient presents with  . Dysuria    HPI: I was consulted to assess the patient's possible microscopic hematuria and some discomfort or burning near the end of the penis.  In the last several weeks he feels some discomfort within the penis approximately at the level of the corona.  He has not had urethral discharge.  He has no other irritative voiding symptoms.  He went to an urgent care and they told him he had blood in the urine.  He thought his symptoms improved on an antibiotic but he still has some of them.  His history was a little bit vague today  He can hold urination for many hours and has no nocturia.  He said he had a kidney stone years ago and is never had surgery.  He has never smoked  Modifying factors: There are no other modifying factors  Associated signs and symptoms: There are no other associated signs and symptoms Aggravating and relieving factors: There are no other aggravating or relieving factors Severity: Moderate Duration: Persistent   PMH: Past Medical History:  Diagnosis Date  . Arthritis   . GERD (gastroesophageal reflux disease)     Surgical History: Past Surgical History:  Procedure Laterality Date  . SHOULDER ARTHROSCOPY WITH ROTATOR CUFF REPAIR Right 08/23/2016   Procedure: Limited  right shoulder arthroscopic debridement, arthroscopic subacromial decompression;  Surgeon: Christena FlakePoggi, John J, MD;  [facility];  Service: Orthopedics;  Laterality: Right;  . SHOULDER OPEN ROTATOR CUFF REPAIR Right 08/23/2016   Procedure: ROTATOR CUFF REPAIR SHOULDER OPEN;  Surgeon: Christena FlakePoggi, John J, MD;  [facility];  Service: Orthopedics;  Laterality: Right;    Home Medications:  Allergies as of 12/18/2016   No Known Allergies     Medication List    as of 12/18/2016  11:25 AM   You have not been prescribed any medications.     Allergies: No Known Allergies  Family History: Family History  Problem Relation Age of Onset  . Heart failure Father   . Stroke Other     Social History:  reports that  has never smoked. he has never used smokeless tobacco. He reports that he does not drink alcohol or use drugs.  ROS: UROLOGY Frequent Urination?: No Hard to postpone urination?: Yes Burning/pain with urination?: Yes Get up at night to urinate?: No Leakage of urine?: No Urine stream starts and stops?: No Trouble starting stream?: No Do you have to strain to urinate?: No Blood in urine?: Yes Urinary tract infection?: Yes Sexually transmitted disease?: No Injury to kidneys or bladder?: No Painful intercourse?: No Weak stream?: No Erection problems?: No Penile pain?: No  Gastrointestinal Nausea?: No Vomiting?: No Indigestion/heartburn?: Yes Diarrhea?: No Constipation?: No  Constitutional Fever: No Night sweats?: No Weight loss?: No Fatigue?: No  Skin Skin rash/lesions?: No Itching?: No  Eyes Blurred vision?: Yes Double vision?: No  Ears/Nose/Throat Sore throat?: No Sinus problems?: No  Hematologic/Lymphatic Swollen glands?: No Easy bruising?: No  Cardiovascular Leg swelling?: No Chest pain?: No  Respiratory Cough?: No Shortness of breath?: No  Endocrine Excessive thirst?: No  Musculoskeletal Back pain?: No Joint pain?: No  Neurological Headaches?: No Dizziness?: No  Psychologic Depression?: No Anxiety?: No  Physical Exam: BP 114/70 (BP Location: Right Arm, Patient Position: Sitting, Cuff Size: Normal)   Pulse 69  Ht 5\' 11"  (1.803 m)   Wt 204 lb 6.4 oz (92.7 kg)   BMI 28.51 kg/m   Constitutional:  Alert and oriented, No acute distress. HEENT: [place] AT, moist mucus membranes.  Trachea midline, no masses. Cardiovascular: No clubbing, cyanosis, or edema. Respiratory: Normal respiratory effort, no  increased work of breathing. GI: Abdomen is soft, nontender, nondistended, no abdominal masses GU: No CVA tenderness.  Normal male genitalia; 50 g benign prostate Skin: No rashes, bruises or suspicious lesions. Lymph: No cervical or inguinal adenopathy. Neurologic: Grossly intact, no focal deficits, moving all 4 extremities. Psychiatric: Normal mood and affect.  Laboratory Data: Lab Results  Component Value Date   WBC 7.4 01/19/2014   HGB 14.8 01/19/2014   HCT 44.8 01/19/2014   MCV 93 01/19/2014   PLT 173 01/19/2014    Lab Results  Component Value Date   CREATININE 1.19 01/19/2014    No results found for: PSA  No results found for: TESTOSTERONE  No results found for: HGBA1C  Urinalysis    Component Value Date/Time   COLORURINE Yellow 01/19/2014 1420   APPEARANCEUR Clear 01/19/2014 1420   LABSPEC 1.020 01/19/2014 1420   PHURINE 8.0 01/19/2014 1420   GLUCOSEU Negative 01/19/2014 1420   HGBUR Negative 01/19/2014 1420   BILIRUBINUR Negative 01/19/2014 1420   KETONESUR Negative 01/19/2014 1420   PROTEINUR Negative 01/19/2014 1420   NITRITE Negative 01/19/2014 1420   LEUKOCYTESUR Negative 01/19/2014 1420    Pertinent Imaging: None  Assessment & Plan: The patient's urine looked normal today and I sent it for culture.  I cannot find any medical records.  I thought it was reasonable to recommend a CT scan and cystoscopy.  The cystoscopy could also address his penile pain.  He has never smoked.  I did not discuss the entity of urethritis today but likely would mention it if his workup is otherwise negative  1. Dysuria 2.  Microscopic hematuria - Urinalysis, Complete   No Follow-up on file.  Martina SinnerMACDIARMID,Kyllian Clingerman A, MD  [facility] [address], [address] [place], [place] [phone]

## 2016-12-20 LAB — URINE CULTURE: ORGANISM ID, BACTERIA: NO GROWTH

## 2017-01-01 ENCOUNTER — Ambulatory Visit: Payer: Self-pay

## 2017-02-05 ENCOUNTER — Ambulatory Visit: Payer: BLUE CROSS/BLUE SHIELD | Admitting: Urology

## 2017-02-13 ENCOUNTER — Ambulatory Visit
Admission: RE | Admit: 2017-02-13 | Discharge: 2017-02-13 | Disposition: A | Discharge status: Home / Self Care | Payer: BLUE CROSS/BLUE SHIELD | Source: Ambulatory Visit | Attending: Urology | Admitting: Urology

## 2017-02-13 DIAGNOSIS — M47896 Other spondylosis, lumbar region: Secondary | ICD-10-CM | POA: Diagnosis not present

## 2017-02-13 DIAGNOSIS — M5136 Other intervertebral disc degeneration, lumbar region: Secondary | ICD-10-CM | POA: Insufficient documentation

## 2017-02-13 DIAGNOSIS — K7689 Other specified diseases of liver: Secondary | ICD-10-CM | POA: Diagnosis not present

## 2017-02-13 DIAGNOSIS — R3129 Other microscopic hematuria: Secondary | ICD-10-CM | POA: Insufficient documentation

## 2017-02-13 MED ORDER — IOPAMIDOL (ISOVUE-300) INJECTION 61%
125.0000 mL | Freq: Once | INTRAVENOUS | Status: AC | PRN
  Administered 2017-02-13: 150 mL via INTRAVENOUS

## 2017-02-19 ENCOUNTER — Ambulatory Visit: Payer: BLUE CROSS/BLUE SHIELD | Admitting: Urology

## 2017-03-05 ENCOUNTER — Ambulatory Visit (INDEPENDENT_AMBULATORY_CARE_PROVIDER_SITE_OTHER): Payer: BLUE CROSS/BLUE SHIELD | Admitting: Urology

## 2017-03-05 ENCOUNTER — Encounter: Payer: Self-pay | Admitting: Urology

## 2017-03-05 VITALS — BP 111/71 | HR 73 | Ht 71.0 in | Wt 197.0 lb

## 2017-03-05 DIAGNOSIS — R3129 Other microscopic hematuria: Secondary | ICD-10-CM | POA: Diagnosis not present

## 2017-03-05 LAB — URINALYSIS, COMPLETE
BILIRUBIN UA: NEGATIVE
GLUCOSE, UA: NEGATIVE
KETONES UA: NEGATIVE
Leukocytes, UA: NEGATIVE
Nitrite, UA: NEGATIVE
PH UA: 7 (ref 5.0–7.5)
PROTEIN UA: NEGATIVE
SPEC GRAV UA: 1.02 (ref 1.005–1.030)
UUROB: 0.2 mg/dL (ref 0.2–1.0)

## 2017-03-05 LAB — MICROSCOPIC EXAMINATION: Epithelial Cells (non renal): NONE SEEN /hpf (ref 0–10)

## 2017-03-05 MED ORDER — LIDOCAINE HCL 2 % EX GEL
1.0000 "application " | Freq: Once | CUTANEOUS | Status: AC
  Administered 2017-03-05: 1 via URETHRAL

## 2017-03-05 MED ORDER — CIPROFLOXACIN HCL 500 MG PO TABS
500.0000 mg | ORAL_TABLET | Freq: Once | ORAL | Status: AC
  Administered 2017-03-05: 500 mg via ORAL

## 2017-03-05 NOTE — Progress Notes (Signed)
03/05/2017 11:25 AM   Marvin Douglas 10-Apr-1965 [id number]  Referring provider: No referring provider defined for this encounter.  Chief Complaint  Patient presents with  . Follow-up    CT scan    HPI: I was consulted to assess the patient's possible microscopic hematuria and some discomfort or burning near the end of the penis.  In the last several weeks he feels some discomfort within the penis approximately at the level of the corona.  He has not had urethral discharge.  He has no other irritative voiding symptoms.  He went to an urgent care and they told him he had blood in the urine.  He thought his symptoms improved on an antibiotic but he still has some of them.  His history was a little bit vague today  He can hold urination for many hours and has no nocturia.  He said he had a kidney stone years ago and is never had surgery.  He has never smoked  The patient's urine looked normal today and I sent it for culture.  I cannot find any medical records.  I thought it was reasonable to recommend a CT scan and cystoscopy.  The cystoscopy could also address his penile pain.  He has never smoked.  I did not discuss the entity of urethritis today but likely would mention it if his workup is otherwise negative  Today Because he stable.  CT scan normal.  Penile pain is gone After written consent and verbal consent patient underwent flexible cystoscopy utilizing sterile technique.  The penile bulbar membranous and prostatic urethra was normal.  He had a few prominent blood vessels within the submucosa the prostatic urethra.  Bladder trigone and bladder mucosa normal.  There was no foreign body cystitis or carcinoma.  He tolerated the procedure well.  Ureters normal    PMH: Past Medical History:  Diagnosis Date  . Arthritis   . GERD (gastroesophageal reflux disease)     Surgical History: Past Surgical History:  Procedure Laterality Date  . SHOULDER ARTHROSCOPY WITH ROTATOR CUFF  REPAIR Right 08/23/2016   Procedure: Limited  right shoulder arthroscopic debridement, arthroscopic subacromial decompression;  Surgeon: Christena Flake, MD;  [facility];  Service: Orthopedics;  Laterality: Right;  . SHOULDER OPEN ROTATOR CUFF REPAIR Right 08/23/2016   Procedure: ROTATOR CUFF REPAIR SHOULDER OPEN;  Surgeon: Christena Flake, MD;  [facility];  Service: Orthopedics;  Laterality: Right;    Home Medications:  Allergies as of 03/05/2017   No Known Allergies     Medication List        Accurate as of 03/05/17 11:25 AM. Always use your most recent med list.          amoxicillin-clavulanate 875-125 MG tablet Commonly known as:  AUGMENTIN Take 1 tablet by mouth 2 (two) times daily.       Allergies: No Known Allergies  Family History: Family History  Problem Relation Age of Onset  . Heart failure Father   . Stroke Other     Social History:  reports that  has never smoked. he has never used smokeless tobacco. He reports that he does not drink alcohol or use drugs.  ROS: UROLOGY Frequent Urination?: No Hard to postpone urination?: No Burning/pain with urination?: No Get up at night to urinate?: No Leakage of urine?: No Urine stream starts and stops?: No Trouble starting stream?: No Do you have to strain to urinate?: No Blood in urine?: No Urinary tract  infection?: No Sexually transmitted disease?: No Injury to kidneys or bladder?: No Painful intercourse?: No Weak stream?: No Erection problems?: No Penile pain?: No  Gastrointestinal Nausea?: No Vomiting?: No Indigestion/heartburn?: No Diarrhea?: No Constipation?: No  Constitutional Fever: No Night sweats?: No Weight loss?: No Fatigue?: No  Skin Skin rash/lesions?: No Itching?: No  Eyes Blurred vision?: No Double vision?: No  Ears/Nose/Throat Sore throat?: No Sinus problems?: No  Hematologic/Lymphatic Swollen glands?: No Easy bruising?:  No  Cardiovascular Leg swelling?: No Chest pain?: No  Respiratory Cough?: Yes Shortness of breath?: No  Endocrine Excessive thirst?: No  Musculoskeletal Back pain?: No Joint pain?: No  Neurological Headaches?: No Dizziness?: No  Psychologic Depression?: No Anxiety?: No  Physical Exam: BP 111/71   Pulse 73   Ht 5\' 11"  (1.803 m)   Wt 197 lb (89.4 kg)   BMI 27.48 kg/m   Constitutional:  Alert and oriented, No acute distress.    Laboratory Data: Lab Results  Component Value Date   WBC 7.4 01/19/2014   HGB 14.8 01/19/2014   HCT 44.8 01/19/2014   MCV 93 01/19/2014   PLT 173 01/19/2014    Lab Results  Component Value Date   CREATININE 1.19 01/19/2014    No results found for: PSA  No results found for: TESTOSTERONE  No results found for: HGBA1C  Urinalysis    Component Value Date/Time   COLORURINE Yellow 01/19/2014 1420   APPEARANCEUR Clear 12/18/2016 1052   LABSPEC 1.020 01/19/2014 1420   PHURINE 8.0 01/19/2014 1420   GLUCOSEU Negative 12/18/2016 1052   GLUCOSEU Negative 01/19/2014 1420   HGBUR Negative 01/19/2014 1420   BILIRUBINUR Negative 12/18/2016 1052   BILIRUBINUR Negative 01/19/2014 1420   KETONESUR Negative 01/19/2014 1420   PROTEINUR Negative 12/18/2016 1052   PROTEINUR Negative 01/19/2014 1420   NITRITE Negative 12/18/2016 1052   NITRITE Negative 01/19/2014 1420   LEUKOCYTESUR Trace 12/18/2016 1052   LEUKOCYTESUR Negative 01/19/2014 1420    Pertinent Imaging:   Assessment & Plan: The patient has been cleared for blood in the urine.  No more penile pain.  See as needed  There are no diagnoses linked to this encounter.  No Follow-up on file.  Martina SinnerMACDIARMID,Meilin Brosh A, MD  [facility] [address], [address] [place], [place] [address] [phone]

## 2017-11-28 ENCOUNTER — Emergency Department: Payer: BLUE CROSS/BLUE SHIELD

## 2017-11-28 ENCOUNTER — Emergency Department
Admission: EM | Admit: 2017-11-28 | Discharge: 2017-11-28 | Disposition: A | Discharge status: Home / Self Care | Payer: BLUE CROSS/BLUE SHIELD | Attending: Emergency Medicine | Admitting: Emergency Medicine

## 2017-11-28 ENCOUNTER — Encounter: Payer: Self-pay | Admitting: Emergency Medicine

## 2017-11-28 ENCOUNTER — Other Ambulatory Visit: Payer: Self-pay

## 2017-11-28 DIAGNOSIS — M5441 Lumbago with sciatica, right side: Secondary | ICD-10-CM | POA: Diagnosis not present

## 2017-11-28 DIAGNOSIS — Z87442 Personal history of urinary calculi: Secondary | ICD-10-CM | POA: Diagnosis not present

## 2017-11-28 DIAGNOSIS — M545 Low back pain: Secondary | ICD-10-CM | POA: Diagnosis present

## 2017-11-28 DIAGNOSIS — R1084 Generalized abdominal pain: Secondary | ICD-10-CM | POA: Diagnosis not present

## 2017-11-28 DIAGNOSIS — M5431 Sciatica, right side: Secondary | ICD-10-CM

## 2017-11-28 LAB — URINALYSIS, COMPLETE (UACMP) WITH MICROSCOPIC
BILIRUBIN URINE: NEGATIVE
Bacteria, UA: NONE SEEN
Glucose, UA: NEGATIVE mg/dL
Ketones, ur: NEGATIVE mg/dL
Leukocytes, UA: NEGATIVE
NITRITE: NEGATIVE
Protein, ur: NEGATIVE mg/dL
SPECIFIC GRAVITY, URINE: 1.008 (ref 1.005–1.030)
Squamous Epithelial / LPF: NONE SEEN (ref 0–5)
pH: 7 (ref 5.0–8.0)

## 2017-11-28 MED ORDER — CYCLOBENZAPRINE HCL 10 MG PO TABS: 10.0000 mg | ORAL_TABLET | Freq: Three times a day (TID) | ORAL | 0 refills | Status: DC | PRN

## 2017-11-28 MED ORDER — CYCLOBENZAPRINE HCL 10 MG PO TABS
10.0000 mg | ORAL_TABLET | Freq: Once | ORAL | Status: DC
  Filled 2017-11-28: qty 1

## 2017-11-28 NOTE — ED Triage Notes (Addendum)
Pt to triage via w/c, brought in by EMS; reports onset right lower back pain radiating down right leg with no accomp symptoms but st pain increases with weight bearing; denies any known injury; st has appt on Friday to eval but when he was leaving work tonight pain increased

## 2017-11-28 NOTE — ED Provider Notes (Signed)
[facility] Emergency Department Provider Note ___   First MD Initiated Contact with Patient 11/28/17 0530     (approximate)  I have reviewed the triage vital signs and the nursing notes.   HISTORY  Chief Complaint Back Pain    HPI Marvin Douglas is a 52 y.o. male with history of kidney stone and right low back pain which patient states has been occurring "for a while presents to the emergency department with acute worsening of the pain.  Patient states that the pain radiates down the posterior portion of his right leg patient states pain is worse with any movement.  Patient denies any nausea vomiting diarrhea constipation.  Patient denies any urinary symptoms including no hematuria or dysuria.  Current pain score 7 out of 10  Past Medical History:  Diagnosis Date  . Arthritis   . GERD (gastroesophageal reflux disease)     There are no active problems to display for this patient.   Past Surgical History:  Procedure Laterality Date  . SHOULDER ARTHROSCOPY WITH ROTATOR CUFF REPAIR Right 08/23/2016   Procedure: Limited  right shoulder arthroscopic debridement, arthroscopic subacromial decompression;  Surgeon: Christena Flake, MD;  Location: [facility];  Service: Orthopedics;  Laterality: Right;  . SHOULDER OPEN ROTATOR CUFF REPAIR Right 08/23/2016   Procedure: ROTATOR CUFF REPAIR SHOULDER OPEN;  Surgeon: Christena Flake, MD;  Location: [facility];  Service: Orthopedics;  Laterality: Right;    Prior to Admission medications   Medication Sig Start Date End Date Taking? Authorizing Provider  amoxicillin-clavulanate (AUGMENTIN) 875-125 MG tablet Take 1 tablet by mouth 2 (two) times daily.    [provider]  cyclobenzaprine (FLEXERIL) 10 MG tablet Take 1 tablet (10 mg total) by mouth 3 (three) times daily as needed. 11/28/17   Darci Current, MD    Allergies No known drug allergies  Family History  Problem Relation Age of Onset    . Heart failure Father   . Stroke Other     Social History Social History   Tobacco Use  . Smoking status: Never Smoker  . Smokeless tobacco: Never Used  Substance Use Topics  . Alcohol use: No  . Drug use: No    Review of Systems Constitutional: No fever/chills Eyes: No visual changes. ENT: No sore throat. Cardiovascular: Denies chest pain. Respiratory: Denies shortness of breath. Gastrointestinal: No abdominal pain.  No nausea, no vomiting.  No diarrhea.  No constipation. Genitourinary: Negative for dysuria. Musculoskeletal: Negative for neck pain.  Positive for back pain. Integumentary: Negative for rash. Neurological: Negative for headaches, focal weakness or numbness.   ____________________________________________   PHYSICAL EXAM:  VITAL SIGNS: ED Triage Vitals  Enc Vitals Group     BP 11/28/17 0346 (!) 142/69     Pulse Rate 11/28/17 0346 63     Resp 11/28/17 0346 18     Temp 11/28/17 0346 98 F (36.7 C)     Temp Source 11/28/17 0346 Oral     SpO2 11/28/17 0346 100 %     Weight 11/28/17 0345 95.3 kg (210 lb)     Height 11/28/17 0345 1.803 m (5\' 11" )     Head Circumference --      Peak Flow --      Pain Score 11/28/17 0344 4     Pain Loc --      Pain Edu? --      Excl. in GC? --     Constitutional: Alert and  oriented. Well appearing and in no acute distress. Eyes: Conjunctivae are normal. Mouth/Throat: Mucous membranes are moist. Oropharynx non-erythematous. Neck: No stridor.  Cardiovascular: Normal rate, regular rhythm. Good peripheral circulation. Grossly normal heart sounds. Respiratory: Normal respiratory effort.  No retractions. Lungs CTAB. Gastrointestinal: Soft and nontender. No distention.  Musculoskeletal: No lower extremity tenderness nor edema. No gross deformities of extremities.  Pain with right lumbar paraspinal muscle palpation. Neurologic:  Normal speech and language. No gross focal neurologic deficits are appreciated.  Skin:  Skin  is warm, dry and intact. No rash noted. Psychiatric: Mood and affect are normal. Speech and behavior are normal.  ____________________________________________   LABS (all labs ordered are listed, but only abnormal results are displayed)  Labs Reviewed  URINALYSIS, COMPLETE (UACMP) WITH MICROSCOPIC - Abnormal; Notable for the following components:      Result Value   Color, Urine STRAW (*)    APPearance CLEAR (*)    Hgb urine dipstick MODERATE (*)    All other components within normal limits   ____________________________________________  _______________________________________  RADIOLOGY I, Darci Current, personally viewed and evaluated these images (plain radiographs) as part of my medical decision making, as well as reviewing the written report by the radiologist.  ED MD interpretation: No acute process demonstrated abdomen pelvis  Official radiology report(s): Ct Renal Stone Study  Result Date: 11/28/2017 CLINICAL DATA:  Right lower back pain radiating down the right leg. Right flank pain. EXAM: CT ABDOMEN AND PELVIS WITHOUT CONTRAST TECHNIQUE: Multidetector CT imaging of the abdomen and pelvis was performed following the standard protocol without IV contrast. COMPARISON:  02/13/2017 FINDINGS: Lower chest: Lung bases are clear. Hepatobiliary: 3 circumscribed low-attenuation lesions in the liver, largest measuring 1.6 cm diameter. Likely cysts. No change. Gallbladder and bile ducts are unremarkable. Pancreas: Unremarkable. No pancreatic ductal dilatation or surrounding inflammatory changes. Spleen: Normal in size without focal abnormality. Adrenals/Urinary Tract: Adrenal glands are unremarkable. Kidneys are normal, without renal calculi, focal lesion, or hydronephrosis. Bladder is unremarkable. Stomach/Bowel: Stomach is within normal limits. Appendix appears normal. No evidence of bowel wall thickening, distention, or inflammatory changes. Vascular/Lymphatic: No significant vascular  findings are present. No enlarged abdominal or pelvic lymph nodes. Reproductive: Prostate gland is enlarged, measuring 4.7 cm diameter. Other: No abdominal wall hernia or abnormality. No abdominopelvic ascites. Musculoskeletal: No acute or significant osseous findings. IMPRESSION: No acute process demonstrated in the abdomen or pelvis. No evidence of bowel obstruction or inflammation. Benign-appearing cysts in the liver. Enlarged prostate gland. Electronically Signed   By: Burman Nieves M.D.   On: 11/28/2017 06:07    _________________ Procedures   ____________________________________________   INITIAL IMPRESSION / ASSESSMENT AND PLAN / ED COURSE  As part of my medical decision making, I reviewed the following data within the electronic MEDICAL RECORD NUMBER  52 year old male presenting with above-stated history and physical exam more consistent with sciatica however given hemoglobin and RBCs in the patient's urine CT scan was performed to evaluate for kidney stone.  CT negative patient's symptoms consistent with sciatica and as such patient will be given Flexeril and recommended to follow-up with orthopedic for further outpatient evaluation. ____________________________________________  FINAL CLINICAL IMPRESSION(S) / ED DIAGNOSES  Final diagnoses:  Sciatica of right side     MEDICATIONS GIVEN DURING THIS VISIT:  Medications  cyclobenzaprine (FLEXERIL) tablet 10 mg (10 mg Oral Refused 11/28/17 0649)     ED Discharge Orders         Ordered    cyclobenzaprine (FLEXERIL) 10 MG  tablet  3 times daily PRN     11/28/17 0640           Note:  This document was prepared using Dragon voice recognition software and may include unintentional dictation errors.    Darci Current, MD 11/28/17 0700

## 2018-03-06 ENCOUNTER — Other Ambulatory Visit: Payer: Self-pay | Admitting: Physical Medicine and Rehabilitation

## 2018-03-06 DIAGNOSIS — M5416 Radiculopathy, lumbar region: Secondary | ICD-10-CM

## 2018-03-11 ENCOUNTER — Other Ambulatory Visit: Payer: Self-pay | Admitting: Physical Medicine and Rehabilitation

## 2018-03-11 DIAGNOSIS — M5416 Radiculopathy, lumbar region: Secondary | ICD-10-CM

## 2018-03-16 ENCOUNTER — Ambulatory Visit
Admission: RE | Admit: 2018-03-16 | Discharge: 2018-03-16 | Disposition: A | Discharge status: Home / Self Care | Payer: BLUE CROSS/BLUE SHIELD | Source: Ambulatory Visit | Attending: Physical Medicine and Rehabilitation | Admitting: Physical Medicine and Rehabilitation

## 2018-03-16 DIAGNOSIS — M5416 Radiculopathy, lumbar region: Secondary | ICD-10-CM

## 2018-03-19 ENCOUNTER — Ambulatory Visit: Payer: BLUE CROSS/BLUE SHIELD

## 2019-07-05 IMAGING — MR MR LUMBAR SPINE W/O CM
4 of 5 series · 26 of 48 positions shown · non-contrast
Comparison: None.

CLINICAL DATA: Lumbar radiculopathy. Right low back pain and leg
pain

EXAM:
MRI LUMBAR SPINE WITHOUT CONTRAST
TECHNIQUE: Multiplanar, multisequence MR imaging of the lumbar spine was
performed. No intravenous contrast was administered.

[Series 3: T2 post-contrast · sagittal · 4.0mm · 0.55mm/px · 6 of 15 slices shown]
[im 1/15]
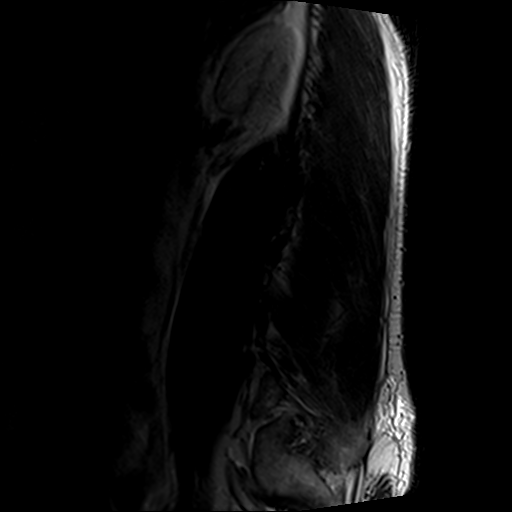
[im 3/15]
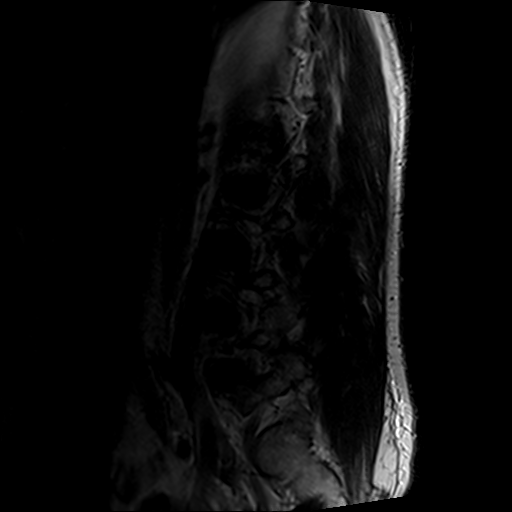
[im 6/15]
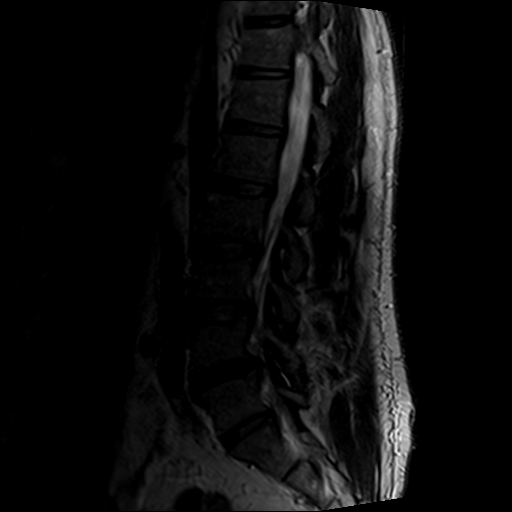
[im 9/15]
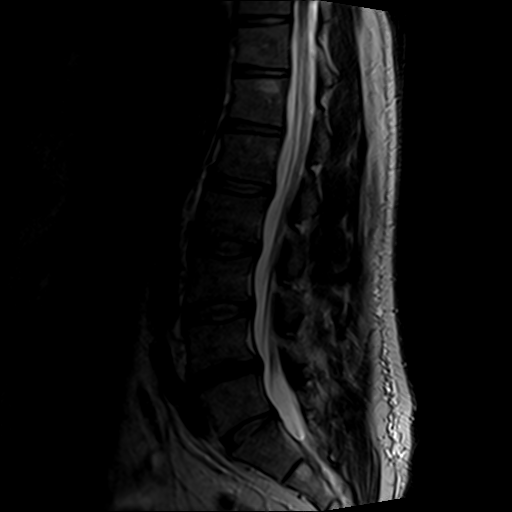
[im 12/15]
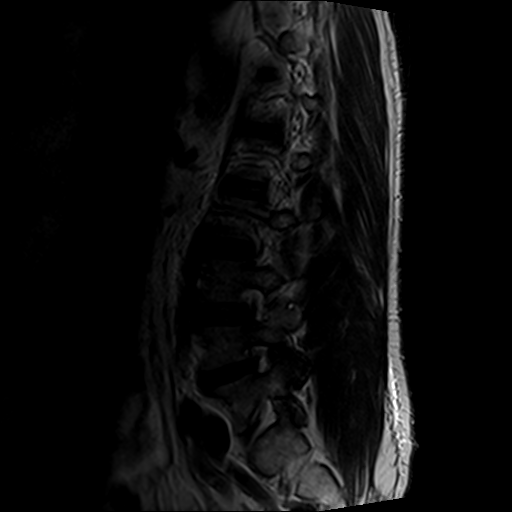
[im 15/15]
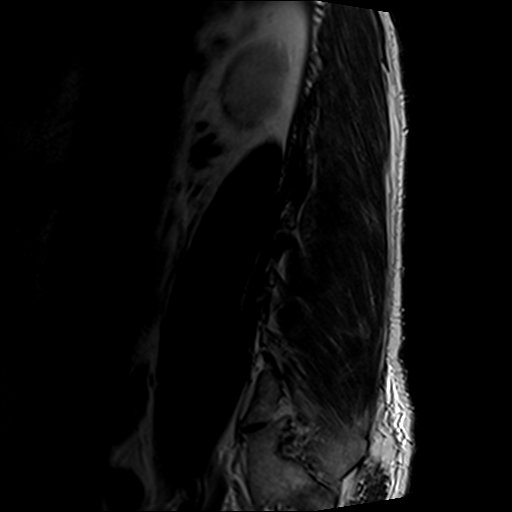

[Series 5: T1 · sagittal · 4.0mm · 0.55mm/px · 6 of 15 slices shown (1 of 2)]
[im 1/15]
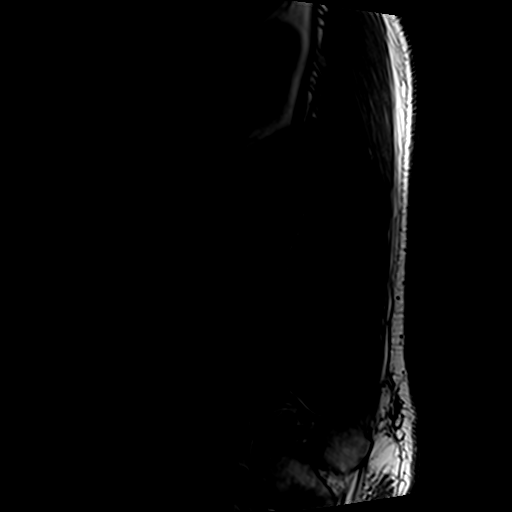
[im 3/15]
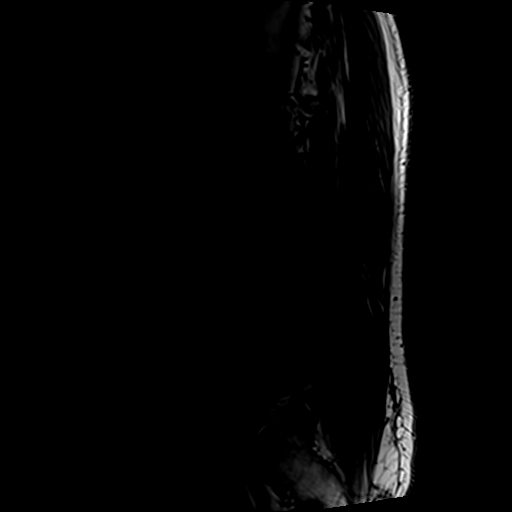
[im 6/15]
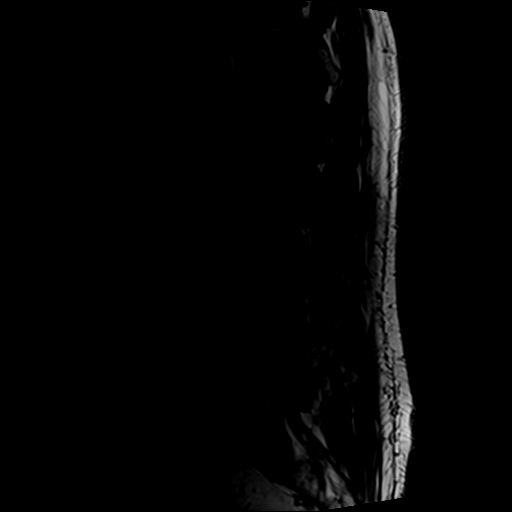
[im 9/15]
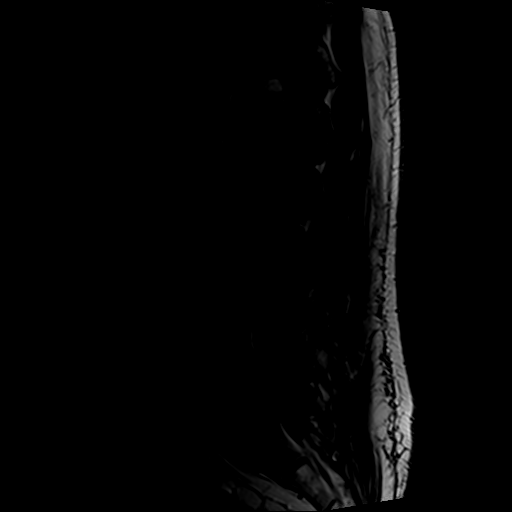
[im 12/15]
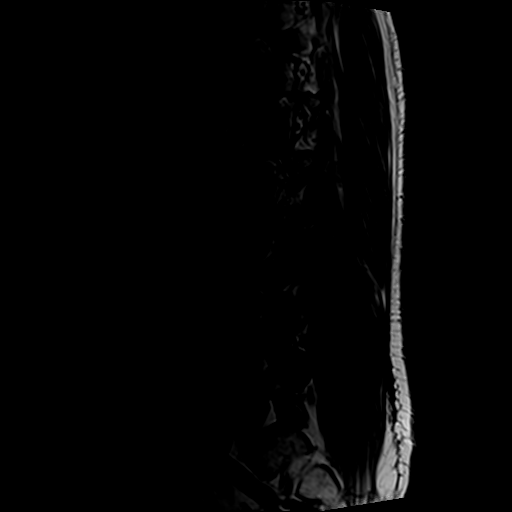
[im 15/15]
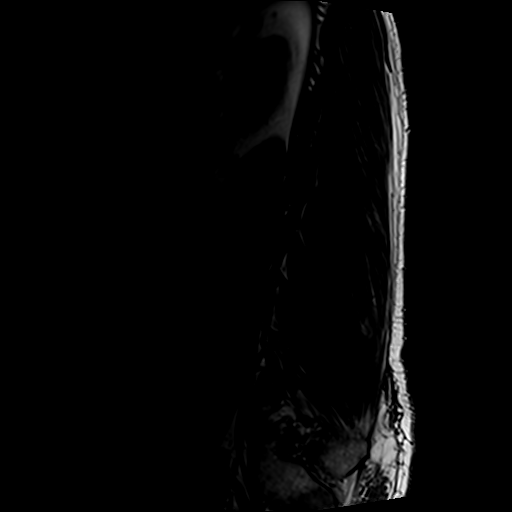

[Series 6: T1 · axial · 4.0mm · 0.35mm/px · z∈[-151,+40]mm · 5 of 40 slices shown (2 of 2)]
[im 1/40]
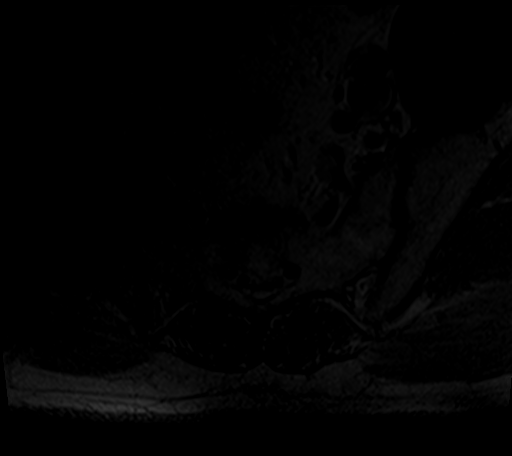
[im 6/40]
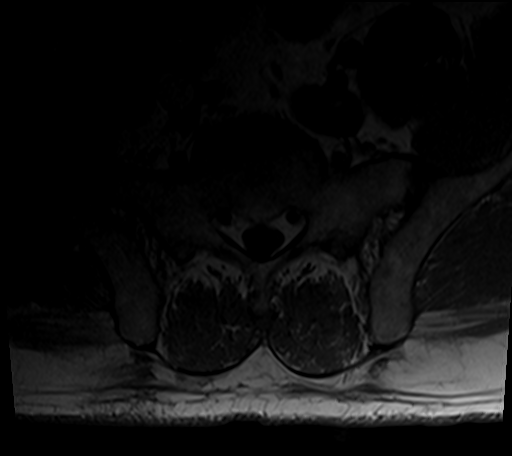
[im 12/40]
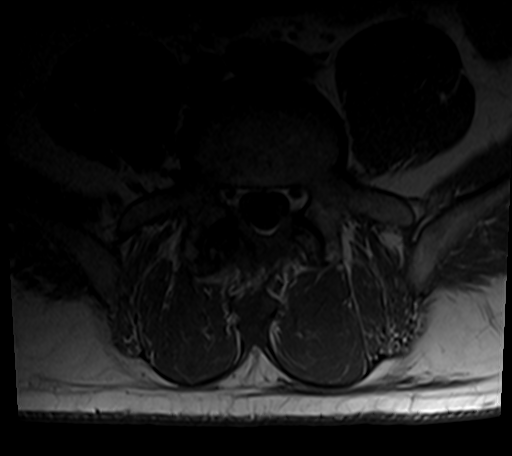
[im 20/40]
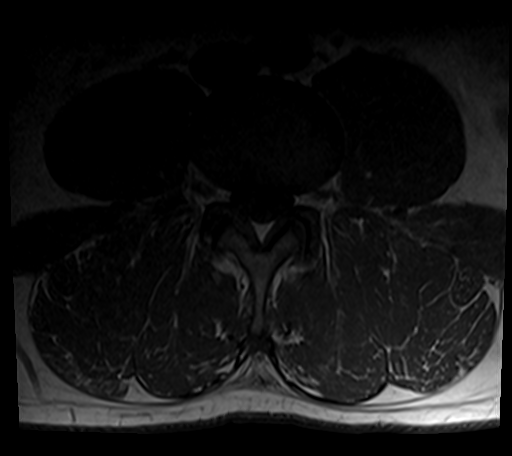
[im 34/40]
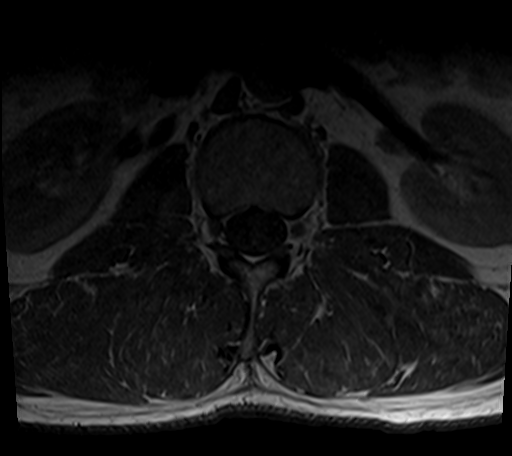

[Series 7: T2 · axial · 4.0mm · 0.70mm/px · z∈[-151,+70]mm · 9 of 40 slices shown]
[im 1/40]
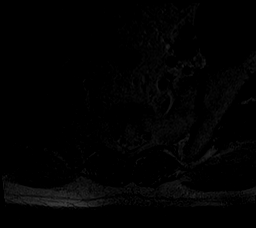
[im 6/40]
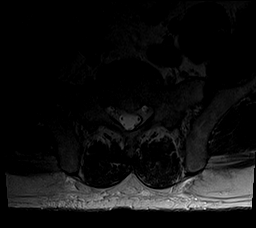
[im 12/40]
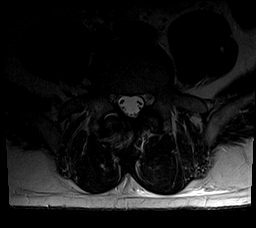
[im 17/40]
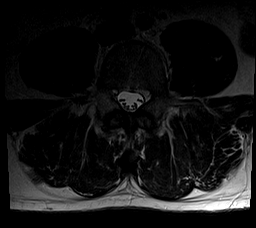
[im 20/40]
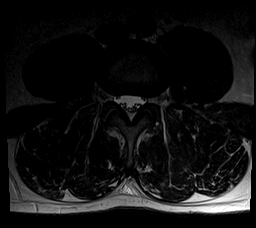
[im 23/40]
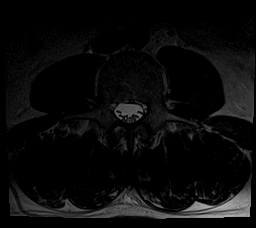
[im 28/40]
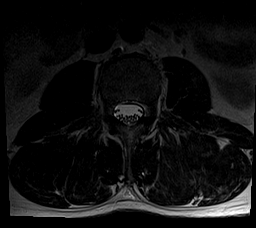
[im 34/40]
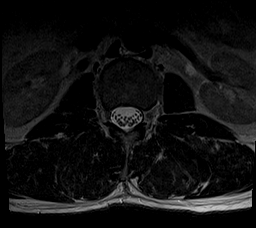
[im 40/40]
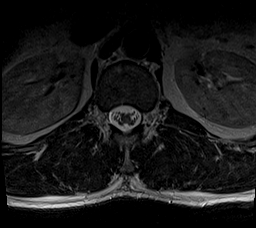

[26 of 48 positions shown; findings below may reference images not displayed]

FINDINGS: Segmentation:  Normal

Alignment:  Normal

Vertebrae: Negative for fracture or mass. Bone marrow edema in the
pedicles of L4-L5 on the right most likely related to facet
degeneration.

Conus medullaris and cauda equina: Conus extends to the L1-2 level.
Conus and cauda equina appear normal.

Paraspinal and other soft tissues: Negative for paraspinous cystic
or solid mass

Disc levels:

L1-2: Negative

L2-3: Negative

L3-4: Negative

L4-5: Diffuse bulging of the disc. Moderate facet hypertrophy
bilaterally. Mild subarticular stenosis bilaterally with mild
impingement of the L5 nerve roots bilaterally.

L5-S1: Negative
IMPRESSION: Disc and facet degeneration at L4-5 causing mild subarticular
stenosis bilaterally. Possible impingement of the L5 nerve roots
bilaterally. Other levels appear normal.

## 2020-08-23 DIAGNOSIS — I2089 Other forms of angina pectoris: Secondary | ICD-10-CM

## 2020-08-23 HISTORY — DX: Other forms of angina pectoris: I20.89

## 2020-10-21 DIAGNOSIS — I209 Angina pectoris, unspecified: Secondary | ICD-10-CM | POA: Diagnosis present

## 2020-10-26 ENCOUNTER — Other Ambulatory Visit: Payer: Self-pay

## 2020-10-26 ENCOUNTER — Encounter: Payer: Self-pay | Admitting: Internal Medicine

## 2020-10-26 ENCOUNTER — Ambulatory Visit
Admission: RE | Admit: 2020-10-26 | Discharge: 2020-10-26 | Disposition: A | Discharge status: Home / Self Care | Payer: BC Managed Care – PPO | Attending: Internal Medicine | Admitting: Internal Medicine

## 2020-10-26 ENCOUNTER — Encounter
Admission: RE | Disposition: A | Discharge status: Home / Self Care | Payer: Self-pay | Source: Home / Self Care | Attending: Internal Medicine

## 2020-10-26 DIAGNOSIS — I10 Essential (primary) hypertension: Secondary | ICD-10-CM | POA: Diagnosis not present

## 2020-10-26 DIAGNOSIS — R9430 Abnormal result of cardiovascular function study, unspecified: Secondary | ICD-10-CM

## 2020-10-26 DIAGNOSIS — E785 Hyperlipidemia, unspecified: Secondary | ICD-10-CM | POA: Insufficient documentation

## 2020-10-26 DIAGNOSIS — R079 Chest pain, unspecified: Secondary | ICD-10-CM | POA: Insufficient documentation

## 2020-10-26 DIAGNOSIS — I209 Angina pectoris, unspecified: Secondary | ICD-10-CM | POA: Diagnosis present

## 2020-10-26 DIAGNOSIS — I200 Unstable angina: Secondary | ICD-10-CM

## 2020-10-26 HISTORY — PX: LEFT HEART CATH AND CORONARY ANGIOGRAPHY: CATH118249

## 2020-10-26 LAB — CARDIAC CATHETERIZATION: Cath EF Quantitative: 65 %

## 2020-10-26 SURGERY — LEFT HEART CATH AND CORONARY ANGIOGRAPHY
Anesthesia: Moderate Sedation

## 2020-10-26 MED ORDER — HEPARIN (PORCINE) IN NACL 1000-0.9 UT/500ML-% IV SOLN
INTRAVENOUS | Status: AC
  Filled 2020-10-26: qty 1000

## 2020-10-26 MED ORDER — HEPARIN (PORCINE) IN NACL 1000-0.9 UT/500ML-% IV SOLN
INTRAVENOUS | Status: DC | PRN
  Administered 2020-10-26: 1000 mL

## 2020-10-26 MED ORDER — SODIUM CHLORIDE 0.9 % WEIGHT BASED INFUSION: 1.0000 mL/kg/h | INTRAVENOUS | Status: DC

## 2020-10-26 MED ORDER — SODIUM CHLORIDE 0.9% FLUSH: 3.0000 mL | INTRAVENOUS | Status: DC | PRN

## 2020-10-26 MED ORDER — MIDAZOLAM HCL 2 MG/2ML IJ SOLN
INTRAMUSCULAR | Status: DC | PRN
  Administered 2020-10-26: 1 mg via INTRAVENOUS

## 2020-10-26 MED ORDER — HYDRALAZINE HCL 20 MG/ML IJ SOLN: 10.0000 mg | INTRAMUSCULAR | Status: DC | PRN

## 2020-10-26 MED ORDER — LIDOCAINE HCL (PF) 1 % IJ SOLN
INTRAMUSCULAR | Status: DC | PRN
  Administered 2020-10-26: 2 mL

## 2020-10-26 MED ORDER — HEPARIN SODIUM (PORCINE) 1000 UNIT/ML IJ SOLN
INTRAMUSCULAR | Status: AC
  Filled 2020-10-26: qty 1

## 2020-10-26 MED ORDER — ASPIRIN 81 MG PO CHEW
CHEWABLE_TABLET | ORAL | Status: AC
  Administered 2020-10-26: 81 mg via ORAL
  Filled 2020-10-26: qty 1

## 2020-10-26 MED ORDER — VERAPAMIL HCL 2.5 MG/ML IV SOLN
INTRAVENOUS | Status: AC
  Filled 2020-10-26: qty 2

## 2020-10-26 MED ORDER — ONDANSETRON HCL 4 MG/2ML IJ SOLN: 4.0000 mg | Freq: Four times a day (QID) | INTRAMUSCULAR | Status: DC | PRN

## 2020-10-26 MED ORDER — FENTANYL CITRATE (PF) 100 MCG/2ML IJ SOLN
INTRAMUSCULAR | Status: DC | PRN
  Administered 2020-10-26: 50 ug via INTRAVENOUS

## 2020-10-26 MED ORDER — FENTANYL CITRATE (PF) 100 MCG/2ML IJ SOLN
INTRAMUSCULAR | Status: AC
  Filled 2020-10-26: qty 2

## 2020-10-26 MED ORDER — ACETAMINOPHEN 325 MG PO TABS: 650.0000 mg | ORAL_TABLET | ORAL | Status: DC | PRN

## 2020-10-26 MED ORDER — LIDOCAINE HCL 1 % IJ SOLN
INTRAMUSCULAR | Status: AC
  Filled 2020-10-26: qty 20

## 2020-10-26 MED ORDER — SODIUM CHLORIDE 0.9% FLUSH: 3.0000 mL | Freq: Two times a day (BID) | INTRAVENOUS | Status: DC

## 2020-10-26 MED ORDER — SODIUM CHLORIDE 0.9 % WEIGHT BASED INFUSION
3.0000 mL/kg/h | INTRAVENOUS | Status: AC
  Administered 2020-10-26: 3 mL/kg/h via INTRAVENOUS

## 2020-10-26 MED ORDER — MIDAZOLAM HCL 2 MG/2ML IJ SOLN
INTRAMUSCULAR | Status: AC
  Filled 2020-10-26: qty 2

## 2020-10-26 MED ORDER — SODIUM CHLORIDE 0.9 % IV SOLN: 250.0000 mL | INTRAVENOUS | Status: DC | PRN

## 2020-10-26 MED ORDER — HEPARIN SODIUM (PORCINE) 1000 UNIT/ML IJ SOLN
INTRAMUSCULAR | Status: DC | PRN
  Administered 2020-10-26: 5000 [IU] via INTRAVENOUS

## 2020-10-26 MED ORDER — ASPIRIN 81 MG PO CHEW: 81.0000 mg | CHEWABLE_TABLET | ORAL | Status: AC

## 2020-10-26 MED ORDER — VERAPAMIL HCL 2.5 MG/ML IV SOLN
INTRAVENOUS | Status: DC | PRN
  Administered 2020-10-26: 2.5 mg via INTRA_ARTERIAL

## 2020-10-26 MED ORDER — IOHEXOL 350 MG/ML SOLN
INTRAVENOUS | Status: DC | PRN
  Administered 2020-10-26: 61 mL via INTRACARDIAC

## 2020-10-26 MED ORDER — LABETALOL HCL 5 MG/ML IV SOLN: 10.0000 mg | INTRAVENOUS | Status: DC | PRN

## 2020-10-26 SURGICAL SUPPLY — 11 items

## 2020-10-26 NOTE — Progress Notes (Signed)
Dr. Gwen Pounds in at bedside, speaking with pt. Re: cath results. Pt. Verbalized understanding of conversation.

## 2020-10-27 ENCOUNTER — Encounter: Payer: Self-pay | Admitting: Internal Medicine

## 2021-12-26 ENCOUNTER — Other Ambulatory Visit: Payer: Self-pay | Admitting: Family Medicine

## 2021-12-26 DIAGNOSIS — N5089 Other specified disorders of the male genital organs: Secondary | ICD-10-CM

## 2022-01-04 ENCOUNTER — Ambulatory Visit
Admission: RE | Admit: 2022-01-04 | Discharge: 2022-01-04 | Disposition: A | Discharge status: Home / Self Care | Payer: BC Managed Care – PPO | Source: Ambulatory Visit | Attending: Family Medicine | Admitting: Family Medicine

## 2022-01-04 DIAGNOSIS — N5089 Other specified disorders of the male genital organs: Secondary | ICD-10-CM | POA: Insufficient documentation

## 2022-01-10 ENCOUNTER — Telehealth: Payer: Self-pay | Admitting: *Deleted

## 2022-01-11 NOTE — Telephone Encounter (Signed)
Error

## 2022-02-02 ENCOUNTER — Encounter: Payer: Self-pay | Admitting: Urology

## 2022-02-02 ENCOUNTER — Ambulatory Visit: Payer: BC Managed Care – PPO | Admitting: Urology

## 2022-02-02 VITALS — BP 137/78 | HR 54 | Ht 71.0 in | Wt 215.0 lb

## 2022-02-02 DIAGNOSIS — N441 Cyst of tunica albuginea testis: Secondary | ICD-10-CM | POA: Diagnosis not present

## 2022-02-02 NOTE — Progress Notes (Signed)
   02/02/2022 8:42 AM   Marvin Douglas [birth year] [id number]  Referring provider: Donnamarie Rossetti, PA-C [address],  [place] [address]  Chief Complaint  Patient presents with   Other    HPI: Marvin Douglas is a 57 y.o. male referred for evaluation of a testicular cyst.  PCP visit 12/22/2021 for a mass left testis present for 6 months No pain Scrotal ultrasound ordered and showed a 3 mm tunica albuginea cyst   PMH: Past Medical History:  Diagnosis Date   Arthritis    GERD (gastroesophageal reflux disease)     Surgical History: Past Surgical History:  Procedure Laterality Date   LEFT HEART CATH AND CORONARY ANGIOGRAPHY N/A 10/26/2020   Procedure: LEFT HEART CATH AND CORONARY ANGIOGRAPHY;  Surgeon: Corey Skains, MD;  Location: [facility];  Service: Cardiovascular;  Laterality: N/A;   SHOULDER ARTHROSCOPY WITH ROTATOR CUFF REPAIR Right 08/23/2016   Procedure: Limited  right shoulder arthroscopic debridement, arthroscopic subacromial decompression;  Surgeon: Corky Mull, MD;  Location: [facility];  Service: Orthopedics;  Laterality: Right;   SHOULDER OPEN ROTATOR CUFF REPAIR Right 08/23/2016   Procedure: ROTATOR CUFF REPAIR SHOULDER OPEN;  Surgeon: Corky Mull, MD;  Location: [facility];  Service: Orthopedics;  Laterality: Right;    Home Medications:  Allergies as of 02/02/2022   No Known Allergies      Medication List        Accurate as of February 02, 2022  8:42 AM. If you have any questions, ask your nurse or doctor.          isosorbide mononitrate 30 MG 24 hr tablet Commonly known as: IMDUR Take 30 mg by mouth daily.   omeprazole 20 MG capsule Commonly known as: PRILOSEC Take 20 mg by mouth daily.        Allergies: No Known Allergies  Family History: Family History  Problem Relation Age of Onset   Heart failure Father    Stroke Other     Social History:  reports that he has never  smoked. He has never used smokeless tobacco. He reports that he does not drink alcohol and does not use drugs.   Physical Exam: BP 137/78   Pulse (!) 54   Ht 5\' 11"  (1.803 m)   Wt 215 lb (97.5 kg)   BMI 29.99 kg/m   Constitutional:  Alert and oriented, No acute distress. HEENT: [place] AT Respiratory: Normal respiratory effort, no increased work of breathing. GU: 3 mm spherical mass superficial left testis medially Psychiatric: Normal mood and affect.   Pertinent Imaging: Scrotal ultrasound images were personally reviewed and interpreted   Assessment & Plan:    1.  Tunica albuginea cyst Discussed with patient this is a benign cyst of the covering of the testis and no treatment or follow-up imaging is needed Recommend monthly testicular self-exam and to return prn any noted changes  Abbie Sons, MD  [place] [address], [place], [place] [address] [phone]

## 2023-05-17 ENCOUNTER — Ambulatory Visit
Admission: RE | Admit: 2023-05-17 | Discharge: 2023-05-17 | Disposition: A | Discharge status: Home / Self Care | Source: Ambulatory Visit | Attending: Student | Admitting: Student

## 2023-05-17 ENCOUNTER — Other Ambulatory Visit: Payer: Self-pay | Admitting: Student

## 2023-05-17 DIAGNOSIS — M25569 Pain in unspecified knee: Secondary | ICD-10-CM | POA: Insufficient documentation

## 2023-05-17 DIAGNOSIS — G8918 Other acute postprocedural pain: Secondary | ICD-10-CM | POA: Insufficient documentation

## 2023-07-20 ENCOUNTER — Other Ambulatory Visit: Payer: Self-pay | Admitting: Student

## 2023-07-20 DIAGNOSIS — M25461 Effusion, right knee: Secondary | ICD-10-CM

## 2023-07-20 DIAGNOSIS — M25561 Pain in right knee: Secondary | ICD-10-CM

## 2023-07-20 DIAGNOSIS — S83241A Other tear of medial meniscus, current injury, right knee, initial encounter: Secondary | ICD-10-CM

## 2023-07-24 ENCOUNTER — Inpatient Hospital Stay
Admission: RE | Admit: 2023-07-24 | Discharge: 2023-07-24 | Discharge status: Home / Self Care | Source: Ambulatory Visit | Attending: Student

## 2023-07-24 DIAGNOSIS — M25561 Pain in right knee: Secondary | ICD-10-CM

## 2023-07-24 DIAGNOSIS — S83241A Other tear of medial meniscus, current injury, right knee, initial encounter: Secondary | ICD-10-CM

## 2023-07-24 DIAGNOSIS — M25461 Effusion, right knee: Secondary | ICD-10-CM

## 2023-09-06 ENCOUNTER — Other Ambulatory Visit: Payer: Self-pay | Admitting: Surgery

## 2023-09-13 ENCOUNTER — Other Ambulatory Visit: Payer: Self-pay

## 2023-09-13 ENCOUNTER — Encounter
Admission: RE | Admit: 2023-09-13 | Discharge: 2023-09-13 | Disposition: A | Discharge status: Home / Self Care | Source: Ambulatory Visit | Attending: Surgery | Admitting: Surgery

## 2023-09-13 VITALS — Ht 71.0 in | Wt 218.0 lb

## 2023-09-13 DIAGNOSIS — Z01812 Encounter for preprocedural laboratory examination: Secondary | ICD-10-CM

## 2023-09-13 DIAGNOSIS — I209 Angina pectoris, unspecified: Secondary | ICD-10-CM

## 2023-09-13 DIAGNOSIS — Z01810 Encounter for preprocedural cardiovascular examination: Secondary | ICD-10-CM

## 2023-09-13 HISTORY — DX: Personal history of other diseases of the digestive system: Z87.19

## 2023-09-13 HISTORY — DX: Prediabetes: R73.03

## 2023-09-13 HISTORY — DX: Ulcerative (chronic) proctitis without complications: K51.20

## 2023-09-13 NOTE — Patient Instructions (Addendum)
 Your procedure is scheduled on: Thursday, August 28 Report to the Registration Desk on the 1st floor of the [facility] To find out your arrival time, please call [phone] between 1PM - 3PM on: Wednesday, August 27 If your arrival time is 6:00 am, do not arrive before that time as the [facility] entrance doors do not open until 6:00 am.  REMEMBER: Instructions that are not followed completely may result in serious medical risk, up to and including death; or upon the discretion of your surgeon and anesthesiologist your surgery may need to be rescheduled.  Do not eat food after midnight the night before surgery.  No gum chewing or hard candies.  You may however, drink CLEAR liquids up to 2 hours before you are scheduled to arrive for your surgery. Do not drink anything within 2 hours of your scheduled arrival time.  Clear liquids include: - water  - apple juice without pulp - gatorade (not RED colors) - black coffee or tea (Do NOT add milk or creamers to the coffee or tea) Do NOT drink anything that is not on this list.  In addition, your doctor has ordered for you to drink the provided:  Ensure Pre-Surgery Clear Carbohydrate Drink  Drinking this carbohydrate drink up to two hours before surgery helps to reduce insulin resistance and improve patient outcomes. Please complete drinking 2 hours before scheduled arrival time.  One week prior to surgery: starting August 21 Stop Anti-inflammatories (NSAIDS) such as Advil, Aleve, Ibuprofen, Motrin, Naproxen, Naprosyn and Aspirin  based products such as Excedrin, Goody's Powder, BC Powder. Stop ANY OVER THE COUNTER supplements until after surgery. Stop multiple vitamins.  You may however, continue to take Tylenol  if needed for pain up until the day of surgery.  Continue taking all of your other prescription medications up until the day of surgery.  ON THE DAY OF SURGERY ONLY TAKE THESE MEDICATIONS WITH SIPS OF WATER:  omeprazole  (PRILOSEC)   No Alcohol for 24 hours before or after surgery.  No Smoking including e-cigarettes for 24 hours before surgery.  No chewable tobacco products for at least 6 hours before surgery.  No nicotine patches on the day of surgery.  Do not use any recreational drugs for at least a week (preferably 2 weeks) before your surgery.  Please be advised that the combination of cocaine and anesthesia may have negative outcomes, up to and including death. If you test positive for cocaine, your surgery will be cancelled.  On the morning of surgery brush your teeth with toothpaste and water, you may rinse your mouth with mouthwash if you wish. Do not swallow any toothpaste or mouthwash.  Use CHG Soap as directed on instruction sheet.  Do not wear jewelry, make-up, hairpins, clips or nail polish.  For welded (permanent) jewelry: bracelets, anklets, waist bands, etc.  Please have this removed prior to surgery.  If it is not removed, there is a chance that hospital personnel will need to cut it off on the day of surgery.  Do not wear lotions, powders, or perfumes.   Do not shave body hair from the neck down 48 hours before surgery.  Contact lenses, hearing aids and dentures may not be worn into surgery.  Do not bring valuables to the hospital. [facility] is not responsible for any missing/lost belongings or valuables.   Notify your doctor if there is any change in your medical condition (cold, fever, infection).  Wear comfortable clothing (specific to your surgery type) to the  hospital.  After surgery, you can help prevent lung complications by doing breathing exercises.  Take deep breaths and cough every 1-2 hours. Your doctor may order a device called an Incentive Spirometer to help you take deep breaths.  If you are being discharged the day of surgery, you will not be allowed to drive home. You will need a responsible individual to drive you home and stay with you for 24 hours after  surgery.   If you are taking public transportation, you will need to have a responsible individual with you.  Please call the Pre-admissions Testing Dept. at [phone] if you have any questions about these instructions.  Surgery Visitation Policy:  Patients having surgery or a procedure may have two visitors.  Children under the age of 7 must have an adult with them who is not the patient.   Merchandiser, retail to address health-related social needs:  [email or web address]      Preparing for Surgery with CHLORHEXIDINE GLUCONATE (CHG) Soap  Chlorhexidine Gluconate (CHG) Soap  o An antiseptic cleaner that kills germs and bonds with the skin to continue killing germs even after washing  o Used for showering the night before surgery and morning of surgery  Before surgery, you can play an important role by reducing the number of germs on your skin.  CHG (Chlorhexidine gluconate) soap is an antiseptic cleanser which kills germs and bonds with the skin to continue killing germs even after washing.  Please do not use if you have an allergy to CHG or antibacterial soaps. If your skin becomes reddened/irritated stop using the CHG.  1. Shower the NIGHT BEFORE SURGERY and the MORNING OF SURGERY with CHG soap.  2. If you choose to wash your hair, wash your hair first as usual with your normal shampoo.  3. After shampooing, rinse your hair and body thoroughly to remove the shampoo.  4. Use CHG as you would any other liquid soap. You can apply CHG directly to the skin and wash gently with a scrungie or a clean washcloth.  5. Apply the CHG soap to your body only from the neck down. Do not use on open wounds or open sores. Avoid contact with your eyes, ears, mouth, and genitals (private parts). Wash face and genitals (private parts) with your normal soap.  6. Wash thoroughly, paying special attention to the area where your surgery will be performed.  7. Thoroughly  rinse your body with warm water.  8. Do not shower/wash with your normal soap after using and rinsing off the CHG soap.  9. Pat yourself dry with a clean towel.  10. Wear clean pajamas to bed the night before surgery.  12. Place clean sheets on your bed the night of your first shower and do not sleep with pets.  13. Shower again with the CHG soap on the day of surgery prior to arriving at the hospital.  14. Do not apply any deodorants/lotions/powders.  15. Please wear clean clothes to the hospital.

## 2023-09-14 ENCOUNTER — Encounter
Admission: RE | Admit: 2023-09-14 | Discharge: 2023-09-14 | Disposition: A | Discharge status: Home / Self Care | Source: Ambulatory Visit | Attending: Surgery | Admitting: Surgery

## 2023-09-14 DIAGNOSIS — Z01810 Encounter for preprocedural cardiovascular examination: Secondary | ICD-10-CM | POA: Insufficient documentation

## 2023-09-14 DIAGNOSIS — R001 Bradycardia, unspecified: Secondary | ICD-10-CM | POA: Insufficient documentation

## 2023-09-14 DIAGNOSIS — R9431 Abnormal electrocardiogram [ECG] [EKG]: Secondary | ICD-10-CM | POA: Diagnosis not present

## 2023-09-14 DIAGNOSIS — I209 Angina pectoris, unspecified: Secondary | ICD-10-CM | POA: Insufficient documentation

## 2023-09-14 DIAGNOSIS — Z01812 Encounter for preprocedural laboratory examination: Secondary | ICD-10-CM

## 2023-09-20 ENCOUNTER — Ambulatory Visit

## 2023-09-20 ENCOUNTER — Other Ambulatory Visit: Payer: Self-pay

## 2023-09-20 ENCOUNTER — Ambulatory Visit
Admission: RE | Admit: 2023-09-20 | Discharge: 2023-09-20 | Disposition: A | Discharge status: Home / Self Care | Attending: Surgery | Admitting: Surgery

## 2023-09-20 ENCOUNTER — Encounter
Admission: RE | Disposition: A | Discharge status: Home / Self Care | Payer: Self-pay | Source: Home / Self Care | Attending: Surgery

## 2023-09-20 ENCOUNTER — Ambulatory Visit: Payer: Self-pay | Admitting: Urgent Care

## 2023-09-20 ENCOUNTER — Encounter: Payer: Self-pay | Admitting: Surgery

## 2023-09-20 DIAGNOSIS — S83231A Complex tear of medial meniscus, current injury, right knee, initial encounter: Secondary | ICD-10-CM | POA: Insufficient documentation

## 2023-09-20 DIAGNOSIS — X500XXA Overexertion from strenuous movement or load, initial encounter: Secondary | ICD-10-CM | POA: Diagnosis not present

## 2023-09-20 DIAGNOSIS — K219 Gastro-esophageal reflux disease without esophagitis: Secondary | ICD-10-CM | POA: Diagnosis not present

## 2023-09-20 DIAGNOSIS — R7303 Prediabetes: Secondary | ICD-10-CM | POA: Insufficient documentation

## 2023-09-20 DIAGNOSIS — S83241A Other tear of medial meniscus, current injury, right knee, initial encounter: Secondary | ICD-10-CM | POA: Diagnosis present

## 2023-09-20 DIAGNOSIS — M25561 Pain in right knee: Secondary | ICD-10-CM | POA: Diagnosis present

## 2023-09-20 DIAGNOSIS — M25461 Effusion, right knee: Secondary | ICD-10-CM | POA: Diagnosis present

## 2023-09-20 HISTORY — PX: MENISCUS REPAIR: SHX5179

## 2023-09-20 HISTORY — PX: KNEE ARTHROSCOPY WITH MEDIAL MENISECTOMY: SHX5651

## 2023-09-20 SURGERY — ARTHROSCOPY, KNEE, WITH MEDIAL MENISCECTOMY
Anesthesia: General | Site: Knee | Laterality: Right

## 2023-09-20 MED ORDER — OXYCODONE HCL 5 MG PO TABS: 5.0000 mg | ORAL_TABLET | Freq: Once | ORAL | Status: DC | PRN

## 2023-09-20 MED ORDER — ORAL CARE MOUTH RINSE: 15.0000 mL | Freq: Once | OROMUCOSAL | Status: AC

## 2023-09-20 MED ORDER — DEXAMETHASONE SODIUM PHOSPHATE 10 MG/ML IJ SOLN
INTRAMUSCULAR | Status: AC
  Filled 2023-09-20: qty 1

## 2023-09-20 MED ORDER — ONDANSETRON HCL 4 MG PO TABS: 4.0000 mg | ORAL_TABLET | Freq: Four times a day (QID) | ORAL | Status: DC | PRN

## 2023-09-20 MED ORDER — LIDOCAINE HCL (PF) 1 % IJ SOLN
INTRAMUSCULAR | Status: AC
  Filled 2023-09-20: qty 30

## 2023-09-20 MED ORDER — OXYCODONE HCL 5 MG PO TABS: 5.0000 mg | ORAL_TABLET | ORAL | 0 refills | Status: AC | PRN

## 2023-09-20 MED ORDER — FENTANYL CITRATE (PF) 100 MCG/2ML IJ SOLN
INTRAMUSCULAR | Status: AC
  Filled 2023-09-20: qty 2

## 2023-09-20 MED ORDER — DEXAMETHASONE SODIUM PHOSPHATE 10 MG/ML IJ SOLN
INTRAMUSCULAR | Status: DC | PRN
  Administered 2023-09-20: 10 mg via INTRAVENOUS

## 2023-09-20 MED ORDER — KETOROLAC TROMETHAMINE 30 MG/ML IJ SOLN
INTRAMUSCULAR | Status: AC
  Filled 2023-09-20: qty 1

## 2023-09-20 MED ORDER — KETOROLAC TROMETHAMINE 30 MG/ML IJ SOLN
30.0000 mg | Freq: Once | INTRAMUSCULAR | Status: AC
  Administered 2023-09-20: 30 mg via INTRAVENOUS

## 2023-09-20 MED ORDER — PROPOFOL 10 MG/ML IV BOLUS
INTRAVENOUS | Status: DC | PRN
  Administered 2023-09-20: 180 mg via INTRAVENOUS

## 2023-09-20 MED ORDER — LIDOCAINE HCL 1 % IJ SOLN
INTRAMUSCULAR | Status: DC | PRN
  Administered 2023-09-20: 60 mL

## 2023-09-20 MED ORDER — LIDOCAINE HCL (CARDIAC) PF 100 MG/5ML IV SOSY
PREFILLED_SYRINGE | INTRAVENOUS | Status: DC | PRN
Start: 2023-09-20 — End: 2023-09-20
  Administered 2023-09-20: 80 mg via INTRAVENOUS

## 2023-09-20 MED ORDER — ONDANSETRON HCL 4 MG/2ML IJ SOLN
INTRAMUSCULAR | Status: DC | PRN
  Administered 2023-09-20: 4 mg via INTRAVENOUS

## 2023-09-20 MED ORDER — LACTATED RINGERS IV SOLN: INTRAVENOUS | Status: DC

## 2023-09-20 MED ORDER — RINGERS IRRIGATION IR SOLN
Status: DC | PRN
  Administered 2023-09-20: 3000 mL

## 2023-09-20 MED ORDER — MIDAZOLAM HCL 2 MG/2ML IJ SOLN
INTRAMUSCULAR | Status: DC | PRN
  Administered 2023-09-20: 2 mg via INTRAVENOUS

## 2023-09-20 MED ORDER — FENTANYL CITRATE (PF) 100 MCG/2ML IJ SOLN
INTRAMUSCULAR | Status: DC | PRN
  Administered 2023-09-20: 25 ug via INTRAVENOUS

## 2023-09-20 MED ORDER — OXYCODONE HCL 5 MG PO TABS: 5.0000 mg | ORAL_TABLET | ORAL | Status: DC | PRN

## 2023-09-20 MED ORDER — METOCLOPRAMIDE HCL 10 MG PO TABS: 5.0000 mg | ORAL_TABLET | Freq: Three times a day (TID) | ORAL | Status: DC | PRN

## 2023-09-20 MED ORDER — METOCLOPRAMIDE HCL 5 MG/ML IJ SOLN: 5.0000 mg | Freq: Three times a day (TID) | INTRAMUSCULAR | Status: DC | PRN

## 2023-09-20 MED ORDER — ONDANSETRON HCL 4 MG/2ML IJ SOLN: 4.0000 mg | Freq: Four times a day (QID) | INTRAMUSCULAR | Status: DC | PRN

## 2023-09-20 MED ORDER — ONDANSETRON HCL 4 MG/2ML IJ SOLN
INTRAMUSCULAR | Status: AC
Start: 2023-09-20 — End: 2023-09-20
  Filled 2023-09-20: qty 2

## 2023-09-20 MED ORDER — LIDOCAINE HCL (PF) 2 % IJ SOLN
INTRAMUSCULAR | Status: AC
Start: 2023-09-20 — End: 2023-09-20
  Filled 2023-09-20: qty 5

## 2023-09-20 MED ORDER — PROPOFOL 1000 MG/100ML IV EMUL
INTRAVENOUS | Status: AC
  Filled 2023-09-20: qty 100

## 2023-09-20 MED ORDER — CHLORHEXIDINE GLUCONATE 0.12 % MT SOLN
OROMUCOSAL | Status: AC
  Filled 2023-09-20: qty 15

## 2023-09-20 MED ORDER — OXYCODONE HCL 5 MG/5ML PO SOLN: 5.0000 mg | Freq: Once | ORAL | Status: DC | PRN

## 2023-09-20 MED ORDER — ACETAMINOPHEN 325 MG PO TABS: 325.0000 mg | ORAL_TABLET | Freq: Four times a day (QID) | ORAL | Status: DC | PRN

## 2023-09-20 MED ORDER — MIDAZOLAM HCL 2 MG/2ML IJ SOLN
INTRAMUSCULAR | Status: AC
Start: 2023-09-20 — End: 2023-09-20
  Filled 2023-09-20: qty 2

## 2023-09-20 MED ORDER — FENTANYL CITRATE (PF) 100 MCG/2ML IJ SOLN: 25.0000 ug | INTRAMUSCULAR | Status: DC | PRN

## 2023-09-20 MED ORDER — SODIUM CHLORIDE 0.9 % IV SOLN: INTRAVENOUS | Status: DC

## 2023-09-20 MED ORDER — CEFAZOLIN SODIUM-DEXTROSE 2-4 GM/100ML-% IV SOLN
2.0000 g | INTRAVENOUS | Status: AC
  Administered 2023-09-20: 2 g via INTRAVENOUS

## 2023-09-20 MED ORDER — PHENYLEPHRINE 80 MCG/ML (10ML) SYRINGE FOR IV PUSH (FOR BLOOD PRESSURE SUPPORT)
PREFILLED_SYRINGE | INTRAVENOUS | Status: AC
  Filled 2023-09-20: qty 10

## 2023-09-20 MED ORDER — CEFAZOLIN SODIUM-DEXTROSE 2-4 GM/100ML-% IV SOLN
INTRAVENOUS | Status: AC
  Filled 2023-09-20: qty 100

## 2023-09-20 MED ORDER — EPHEDRINE SULFATE-NACL 50-0.9 MG/10ML-% IV SOSY
PREFILLED_SYRINGE | INTRAVENOUS | Status: DC | PRN
Start: 2023-09-20 — End: 2023-09-20
  Administered 2023-09-20 (×3): 5 mg via INTRAVENOUS

## 2023-09-20 MED ORDER — CHLORHEXIDINE GLUCONATE 0.12 % MT SOLN
15.0000 mL | Freq: Once | OROMUCOSAL | Status: AC
  Administered 2023-09-20: 15 mL via OROMUCOSAL

## 2023-09-20 MED ORDER — BUPIVACAINE-EPINEPHRINE (PF) 0.5% -1:200000 IJ SOLN
INTRAMUSCULAR | Status: AC
  Filled 2023-09-20: qty 50

## 2023-09-20 MED ORDER — BUPIVACAINE-EPINEPHRINE (PF) 0.5% -1:200000 IJ SOLN
INTRAMUSCULAR | Status: DC | PRN
Start: 2023-09-20 — End: 2023-09-20
  Administered 2023-09-20: 30 mL

## 2023-09-20 SURGICAL SUPPLY — 41 items
ANCHOR 4.5 FOOTPRINT ULTRA (Anchor) IMPLANT
BAG COUNTER SPONGE SURGICOUNT (BAG) IMPLANT
BIT DRILL 4X4.5 FOOTPRINT STR (BIT) IMPLANT
BLADE FULL RADIUS 3.5 (BLADE) ×1 IMPLANT
BLADE SHAVER 4.5X7 STR FR (MISCELLANEOUS) ×1 IMPLANT
BNDG ELASTIC 6INX 5YD STR LF (GAUZE/BANDAGES/DRESSINGS) ×1 IMPLANT
BNDG ESMARCH 6X12 STRL LF (GAUZE/BANDAGES/DRESSINGS) ×1 IMPLANT
BRACE KNEE POST OP SHORT (BRACE) IMPLANT
CATH ROBINSON RED A/P 12FR (CATHETERS) IMPLANT
CHLORAPREP W/TINT 26 (MISCELLANEOUS) ×1 IMPLANT
CUFF TRNQT CYL 24X4X16.5-23 (TOURNIQUET CUFF) IMPLANT
CUFF TRNQT CYL 34X4.125X (TOURNIQUET CUFF) IMPLANT
CUP MEDICINE 2OZ PLAST GRAD ST (MISCELLANEOUS) ×1 IMPLANT
DRAPE ARTHROSCOPY W/POUCH 90 (DRAPES) ×1 IMPLANT
DRAPE IMP U-DRAPE 54X76 (DRAPES) ×1 IMPLANT
ELECTRODE REM PT RTRN 9FT ADLT (ELECTROSURGICAL) ×1 IMPLANT
GAUZE SPONGE 4X4 12PLY STRL (GAUZE/BANDAGES/DRESSINGS) ×1 IMPLANT
GLOVE BIO SURGEON STRL SZ8 (GLOVE) ×2 IMPLANT
GLOVE INDICATOR 8.0 STRL GRN (GLOVE) ×1 IMPLANT
GOWN STRL REUS W/ TWL LRG LVL3 (GOWN DISPOSABLE) ×1 IMPLANT
GOWN STRL REUS W/ TWL XL LVL3 (GOWN DISPOSABLE) ×2 IMPLANT
IV LR IRRIG 3000ML ARTHROMATIC (IV SOLUTION) ×1 IMPLANT
KIT MENISCAL ROOT REPAIR (KITS) IMPLANT
KIT TURNOVER KIT A (KITS) ×1 IMPLANT
MANIFOLD NEPTUNE II (INSTRUMENTS) ×2 IMPLANT
NDL HYPO 21X1.5 SAFETY (NEEDLE) ×1 IMPLANT
NEEDLE HYPO 21X1.5 SAFETY (NEEDLE) ×1 IMPLANT
PACK ARTHROSCOPY KNEE (MISCELLANEOUS) ×1 IMPLANT
PASSER SUT FASTPASS MINI (KITS) IMPLANT
SPONGE T-LAP 18X18 ~~LOC~~+RFID (SPONGE) ×1 IMPLANT
SUT PROLENE 4 0 PS 2 18 (SUTURE) ×1 IMPLANT
SUT TICRON COATED BLUE 2 0 30 (SUTURE) IMPLANT
SUT ULTRALOOP WHITE/BLUE (SUTURE) IMPLANT
SYR 30ML LL (SYRINGE) ×1 IMPLANT
SYR 50ML LL SCALE MARK (SYRINGE) ×1 IMPLANT
SYR TOOMEY 50ML (SYRINGE) IMPLANT
TISSUE GRAFT COLLECTOR (SYSTAGENIX WOUND MANAGEMENT) IMPLANT
TRAP FLUID SMOKE EVACUATOR (MISCELLANEOUS) ×1 IMPLANT
TUBE SET DOUBLEFLO INFLOW (TUBING) ×1 IMPLANT
WAND WEREWOLF FLOW 90D (MISCELLANEOUS) ×1 IMPLANT
WATER STERILE IRR 500ML POUR (IV SOLUTION) ×1 IMPLANT

## 2023-09-20 NOTE — H&P (Addendum)
 History of Present Illness:  Marvin Douglas is a 58 y.o. male who has severe Right knee pain. Patient initially sustained an injury in April 2025 when he accidentally twisted his leg stepping into a ditch and described a hyperextension like injury. He has been having consistent pain since experiencing this injury despite analgesic medications, NSAIDs, injections and activity modification. He states that the pain localizes along the medial aspect of his knee. He states it is made worse with any twisting or prolonged standing or ambulation. He denies having any numbness, tingling or radiation symptoms. It greatly affects his ability to ambulate long distances and perform his ADLs as he would like. 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 He has requested operative intervention for relief of his DJD symptoms. He denies any cardiac or pulmonary history. No previous DVTs or clots. He is not a diabetic. Denies having any previous surgeries on this knee.  Social Hx: Patient lives at home with his wife who will be helping look after him postoperatively. He works at a [employer]. He denies any alcohol use, illicit drug use, nicotine use or smoking.  Allergies: No Known Allergies  Medicines: acetaminophen  (TYLENOL ) 500 MG tablet Take 1,000 mg by mouth every 8 (eight) hours as needed for Pain  doxycycline  (VIBRAMYCIN ) 100 MG capsule as directed  ibuprofen (MOTRIN) 800 MG tablet Take 1 tablet (800 mg total) by mouth 3 (three) times daily as needed for Pain Tid prn with food 30 tablet 0  multivitamin tablet Take 1 tablet by mouth once daily  omeprazole (PRILOSEC OTC) 20 MG EC tablet Take 20 mg by mouth once daily   Past Medical History:  COVID-19 05/2020  Depression  Duodenitis 02/06/2014  Dysphagia  Esophagitis 02/06/2014  Gastropathy 02/06/2014 (reactive)  GERD (gastroesophageal reflux disease)  HH (hiatus  hernia) 02/06/2014 (small)  History of GI bleed  Hypoglycemia  Multiple gastric ulcers  Schatzki's ring 02/06/2014 (non-obstructing)  Ulcerative proctitis (CMS/HHS-HCC)   Past Surgical History:  SIGMOIDOSCOPY 06/01/1999  COLONOSCOPY 06/24/2012 (5 Years)  EGD 02/06/2014 (GERD/Esophagitis/No Repeat/MUS)  Limited arthroscopic debridement,arthroscopic subacromial decompression, and mini-open rotator cuff repair, right shoulder Right 08/23/2016 (Dr. Edie)  COLONOSCOPY 12/09/2007, 07/01/1997  EGD 11/05/2008, 10/24/2007, 08/22/2007 (No repeat)   Physical Exam:  Ht: Wt: BMI: There is no height or weight on file to calculate BMI.  General/Constitutional: No apparent distress: well-nourished and well developed. Eyes: Pupils equal, round with synchronous movement. Lymphatic: No palpable adenopathy. Respiratory: Patient has good chest rise and fall with inspiration and expiration. All lung fields are clear to auscultation bilaterally. There is no Rales, rhonchi or wheezes appreciated. Cardiovascular: Upon auscultation there is a regular rate and rhythm without any murmurs, rubs, gallops or heaves appreciated. There does not appear to be any swelling down the lower extremities. Posterior tibial pulses appreciated bilaterally, 2+. Integumentary: No impressive skin lesions present, except as noted in detailed exam. Neuro/Psych: Normal mood and affect, oriented to person, place and time. Musculoskeletal: see exam below  Right knee exam: Examination of the right knee reveals no bony abnormality, no edema, mild effusion and no ecchymosis. There is no valgus or varus abnormality. The patient is non-tender along the lateral joint line, and is moderately tender along the medial joint line. The patient has full knee extension and is able to flex close to 95 degrees with increased pain. The patient has a positive rotational Mcmurray test. There is no retropatellar  discomfort. The patient has a negative patella  stretch test. The patient has a negative varus stress test and a negative valgus stress test, in looking for stability. The patient has a trace positive Lachman's test. Negative posterior drawer. He is neurovascularly intact all dermatomes extending down his right lower extremity and into his right foot. Posterior tibial pulses appreciated, 2+.  Imaging: MR KNEE RIGHT WO CONTRAST:  1. Small linear tear to the posterior horn of the medial meniscus near the  junction with the body. This involves the undersurface.  2. Mild-to-moderate medial compartment chondromalacia with mild degenerative edema.  3. Mild to moderate joint effusion and small ruptured popliteal cyst.  4. Mild myxoid degeneration anterior cruciate ligament with largely intact fibers.   Impression: 1. Tear of medial meniscus of right knee. 2. Primary osteoarthritis of right knee. 3. Effusion of right knee.  Assessment:  Tear of right medial meniscus   Plan: The treatment options, including both surgical and nonsurgical choices, have been discussed in detail with the patient.  The patient would like to proceed with surgical intervention to include a right knee arthroscopy with debridement and repair versus partial medial meniscectomy.  The risks (including bleeding, infection, nerve and/or blood vessel injury, persistent or recurrent pain, stiffness of the knee, recurrence of the tear, need for further surgery, blood clots, strokes, heart attacks or arrhythmias, pneumonia, etc.) and benefits of the surgical procedure were discussed.  The patient states his understanding and agrees to proceed.  A formal written consent will be obtained by the nursing staff.   H&P reviewed and patient re-examined. No changes.

## 2023-09-20 NOTE — Progress Notes (Signed)
 Crutches and toe touch weight bearing instructions provided to this patient and his wife. Patient practiced weight bearing instructions and crutch use prior to being discharged.

## 2023-09-20 NOTE — Op Note (Signed)
 09/20/2023  2:57 PM  Patient:   Marvin Douglas  Pre-Op Diagnosis:   Complex medial meniscus tear, right knee.  Postoperative diagnosis:   Medial meniscus root tear, right knee.  Procedure:   Arthroscopic debridement with arthroscopically-assisted repair of medial meniscus root tear, right knee.  Surgeon:   DOROTHA Reyes Maltos, M.D.  Assistant:   None  Anesthesia:   General LMA.  Findings:   As above. There was a complex tear of the posterior portion of the medial meniscus including a near full-thickness medial meniscal root tear. The lateral meniscus was in satisfactory condition as were the anterior and posterior cruciate ligaments. There were grade 1 chondromalacial changes involving the medial tibial plateau and femoral trochlea. The remaining articular surfaces all were in satisfactory condition.  Complications:   None.  EBL:   10 cc.  Total fluids:   700 cc of crystalloid.  Tourniquet time:   None  Drains:   None  Closure:   Staples.  Brief clinical note:   The patient is a 58 year old male with a 4 to 73-month history of medial sided right knee pain following a twisting injury. These symptoms have persisted despite medications, activity modification, etc. The patient's history and examination were consistent with a medial meniscus tear. An MRI scan demonstrated the presence of a complex medial meniscus tear. The patient presents at this time for arthroscopy, debridement, and repair versus partial medial meniscectomy.  Procedure:   The patient was brought into the operating room and lain in the supine position. After adequate general laryngeal mask anesthesia was obtained, a timeout was performed to verify the appropriate side. The patient's right knee was injected sterilely using a solution of 30 cc of 1% lidocaine  and 30 cc of 0.5% Sensorcaine  with epinephrine . The left lower extremity was prepped with ChloraPrep solution before being draped sterilely. Preoperative antibiotics  were administered.   The expected portal sites were injected with 0.5% Sensorcaine  with epinephrine  before the camera was placed in the anterolateral portal and instrumentation performed through the anteromedial portal. The knee was sequentially examined beginning in the suprapatellar pouch, then progressing to the patellofemoral space, the medial gutter compartment, the notch, and finally the lateral compartment and gutter. The findings were as described above. Abundant reactive synovial tissues anteriorly were debrided using the full-radius resector in order to improve visualization.   The medial collateral ligament was lightly pie crusted in order to improve visualization of the medial compartment. The medial meniscus was carefully probed and demonstrated both a complex tear of the posterior portion of the medial meniscus as well as an unstable tear of the meniscal root.  The more central torn portion of the posterior portion of the medial meniscus was debrided back to stable margins using a combination of the meniscal baskets and full-radius resector.    The meniscal root tear was repaired using the [facility] [employer] meniscal root repair system. The end of the tear was freshened with a full-radius resector. Utilizing the FirstPass suture passer, two #2 FiberWire sutures were placed into the meniscal root and brought out through the medial portal. The attachment site on the proximal tibia was curetted and debrided with the full-radius resector to expose good bleeding bone. The [facility] guide was positioned in the over-the-top position and, utilizing the 55 degree angle setting, the drill/sleeve combination was drilled up into the proximal tibia through a short anterior incision. Once its position was verified intra-articularly, the central drill was removed, leaving the sleeve in  place.   A looped passing suture was placed up through the retained sleeve and pulled out through the anteromedial wound.  Using the passing loop, the FiberWire was drawn down through the drill hole in the proximal tibia and brought out anteriorly. A single [employer] anchor was placed in the anterior tibial cortex to secure the sutures. The repair was assessed and found to be stable to probing. It also appeared to be stable with range of motion of the knee. The instruments were removed from the joint after suctioning the excess fluid.   The subcutaneous tissues in the anterior wound were reapproximated in two layers using 2-0 Vicryl interrupted sutures before the skin was closed using 4-0 Prolene interrupted sutures. The portal sites also were closed using 4-0 Prolene interrupted sutures. A sterile bulky dressing was applied to the knee before the patient was placed into a hinged knee brace with the hinges set at 0-90, but locked in extension. The patient was then awakened, extubated, and returned to the recovery room in satisfactory condition after tolerating the procedure well.

## 2023-09-20 NOTE — Discharge Instructions (Addendum)
 Orthopedic discharge instructions: Keep dressing dry and intact.  May shower after dressing changed on post-op day #4 (Monday).  Cover sutures with Band-Aids after drying off, then reapply hinged knee brace. Apply ice frequently to knee. Take ibuprofen 600-800 mg TID with meals for 3-5 days, then as necessary. Take oxycodone  as prescribed when needed.  May supplement with ES Tylenol  if necessary. Maximum toe-touch weight-bearing on right foot so long as brace is on and locked in extension - use crutches for ambulation. Follow-up in 10-14 days or as scheduled.

## 2023-09-20 NOTE — Transfer of Care (Signed)
 Immediate Anesthesia Transfer of Care Note  Patient: Marvin Douglas  Procedure(s) Performed: ARTHROSCOPY, KNEE, WITH MEDIAL MENISCECTOMY (Right: Knee) REPAIR, MENISCUS, KNEE (Right: Knee)  Patient Location: PACU  Anesthesia Type:General  Level of Consciousness: drowsy  Airway & Oxygen Therapy: Patient Spontanous Breathing  Post-op Assessment: Report given to RN and Post -op Vital signs reviewed and stable  Post vital signs: stable  Last Vitals:  Vitals Value Taken Time  BP 157/93 09/20/23 14:38  Temp    Pulse 59 09/20/23 14:42  Resp 15 09/20/23 14:42  SpO2 97 % 09/20/23 14:42  Vitals shown include unfiled device data.  Last Pain:  Vitals:   09/20/23 1147  TempSrc: Temporal  PainSc: 0-No pain         Complications: No notable events documented.

## 2023-09-20 NOTE — Anesthesia Preprocedure Evaluation (Signed)
 Anesthesia Evaluation  Patient identified by MRN, date of birth, ID band Patient awake    Reviewed: Allergy & Precautions, NPO status , Patient's Chart, lab work & pertinent test results  History of Anesthesia Complications Negative for: history of anesthetic complications  Airway Mallampati: III  TM Distance: >3 FB Neck ROM: full    Dental  (+) Chipped   Pulmonary sleep apnea    Pulmonary exam normal        Cardiovascular Exercise Tolerance: Good (-) angina (-) Past MI negative cardio ROS Normal cardiovascular exam     Neuro/Psych negative neurological ROS  negative psych ROS   GI/Hepatic Neg liver ROS, PUD,GERD  Controlled,,  Endo/Other  negative endocrine ROS    Renal/GU      Musculoskeletal   Abdominal   Peds  Hematology negative hematology ROS (+)   Anesthesia Other Findings Past Medical History: No date: Arthritis No date: GERD (gastroesophageal reflux disease) No date: History of GI bleed No date: Pre-diabetes 08/2020: Stable angina pectoris (HCC) No date: Ulcerative proctitis (HCC)  Past Surgical History: 12/09/2007: COLONOSCOPY 06/24/2012: COLONOSCOPY 02/06/2014: ESOPHAGOGASTRODUODENOSCOPY 10/26/2020: LEFT HEART CATH AND CORONARY ANGIOGRAPHY; N/A     Comment:  Procedure: LEFT HEART CATH AND CORONARY ANGIOGRAPHY;                Surgeon: Hester Wolm PARAS, MD;  Location: [facility] INVAS[facility]               CV LAB;  Service: Cardiovascular;  Laterality: N/A; 08/23/2016: SHOULDER ARTHROSCOPY WITH ROTATOR CUFF REPAIR; Right     Comment:  Procedure: Limited  right shoulder arthroscopic               debridement, arthroscopic subacromial decompression;                Surgeon: Edie Norleen PARAS, MD;  Location: [facility];  Service: Orthopedics;  Laterality: Right; 08/23/2016: SHOULDER OPEN ROTATOR CUFF REPAIR; Right     Comment:  Procedure: ROTATOR CUFF REPAIR SHOULDER OPEN;  Surgeon:                Edie Norleen PARAS, MD;  Location: [facility];                Service: Orthopedics;  Laterality: Right; 06/01/1999: SIGMOIDOSCOPY  BMI    Body Mass Index: 30.40 kg/m      Reproductive/Obstetrics negative OB ROS                              Anesthesia Physical Anesthesia Plan  ASA: 3  Anesthesia Plan: General LMA   Post-op Pain Management:    Induction: Intravenous  PONV Risk Score and Plan: Dexamethasone , Ondansetron , Midazolam  and Treatment may vary due to age or medical condition  Airway Management Planned: LMA  Additional Equipment:   Intra-op Plan:   Post-operative Plan: Extubation in OR  Informed Consent: I have reviewed the patients History and Physical, chart, labs and discussed the procedure including the risks, benefits and alternatives for the proposed anesthesia with the patient or authorized representative who has indicated his/her understanding and acceptance.     Dental Advisory Given  Plan Discussed with: Anesthesiologist, CRNA and Surgeon  Anesthesia Plan Comments: (Patient consented for risks of anesthesia including but not limited to:  - adverse reactions to medications - damage to eyes, teeth, lips or other oral  mucosa - nerve damage due to positioning  - sore throat or hoarseness - Damage to heart, brain, nerves, lungs, other parts of body or loss of life  Patient voiced understanding and assent.)        Anesthesia Quick Evaluation

## 2023-09-21 ENCOUNTER — Encounter: Payer: Self-pay | Admitting: Surgery

## 2023-09-21 NOTE — Anesthesia Postprocedure Evaluation (Signed)
 Anesthesia Post Note  Patient: Marvin Douglas  Procedure(s) Performed: ARTHROSCOPY, KNEE, WITH MEDIAL MENISCECTOMY (Right: Knee) REPAIR, MENISCUS, KNEE (Right: Knee)  Patient location during evaluation: PACU Anesthesia Type: General Level of consciousness: awake and alert Pain management: pain level controlled Vital Signs Assessment: post-procedure vital signs reviewed and stable Respiratory status: spontaneous breathing, nonlabored ventilation and respiratory function stable Cardiovascular status: blood pressure returned to baseline and stable Postop Assessment: no apparent nausea or vomiting Anesthetic complications: no   No notable events documented.   Last Vitals:  Vitals:   09/20/23 1530 09/20/23 1552  BP: (!) 152/79 (!) 142/72  Pulse: (!) 57 60  Resp: 16 15  Temp: (!) 36.1 C 36.6 C  SpO2: 100% 100%    Last Pain:  Vitals:   09/20/23 1552  TempSrc: Temporal  PainSc: 0-No pain                 Fairy POUR Casimiro Lienhard

## 2023-12-25 ENCOUNTER — Other Ambulatory Visit: Payer: Self-pay | Admitting: Gastroenterology

## 2023-12-25 DIAGNOSIS — K769 Liver disease, unspecified: Secondary | ICD-10-CM

## 2024-01-14 ENCOUNTER — Ambulatory Visit
Admission: RE | Admit: 2024-01-14 | Discharge: 2024-01-14 | Disposition: A | Discharge status: Home / Self Care | Source: Ambulatory Visit | Attending: Gastroenterology | Admitting: Gastroenterology

## 2024-01-14 DIAGNOSIS — K769 Liver disease, unspecified: Secondary | ICD-10-CM | POA: Diagnosis present

## 2024-01-14 MED ORDER — IOHEXOL 300 MG/ML  SOLN
100.0000 mL | Freq: Once | INTRAMUSCULAR | Status: AC | PRN
  Administered 2024-01-14: 100 mL via INTRAVENOUS
